# Patient Record
Sex: Male | Born: 1940 | Race: Black or African American | Hispanic: No | Marital: Single | Smoking: Current every day smoker
Health system: Southern US, Community
[De-identification: ages and names within clinical notes are randomized; demographics above are authoritative.]

## PROBLEM LIST (undated history)

## (undated) DIAGNOSIS — R4189 Other symptoms and signs involving cognitive functions and awareness: Secondary | ICD-10-CM

## (undated) DIAGNOSIS — E119 Type 2 diabetes mellitus without complications: Secondary | ICD-10-CM

## (undated) DIAGNOSIS — I10 Essential (primary) hypertension: Secondary | ICD-10-CM

## (undated) DIAGNOSIS — E7800 Pure hypercholesterolemia, unspecified: Secondary | ICD-10-CM

## (undated) DIAGNOSIS — J449 Chronic obstructive pulmonary disease, unspecified: Secondary | ICD-10-CM

## (undated) DIAGNOSIS — F29 Unspecified psychosis not due to a substance or known physiological condition: Secondary | ICD-10-CM

## (undated) HISTORY — PX: ABDOMINAL SURGERY: SHX537

---

## 2010-02-07 ENCOUNTER — Inpatient Hospital Stay: Payer: Self-pay | Admitting: Internal Medicine

## 2010-02-10 ENCOUNTER — Inpatient Hospital Stay: Payer: Self-pay | Admitting: Unknown Physician Specialty

## 2011-04-12 ENCOUNTER — Ambulatory Visit: Payer: Self-pay | Admitting: Cardiovascular Disease

## 2017-02-16 ENCOUNTER — Encounter: Payer: Self-pay | Admitting: Emergency Medicine

## 2017-02-16 ENCOUNTER — Emergency Department: Payer: Medicare Other

## 2017-02-16 ENCOUNTER — Inpatient Hospital Stay
Admission: EM | Admit: 2017-02-16 | Discharge: 2017-02-20 | DRG: 689 | Disposition: A | Discharge status: Home / Self Care | Payer: Medicare Other | Attending: Internal Medicine | Admitting: Internal Medicine

## 2017-02-16 DIAGNOSIS — E860 Dehydration: Secondary | ICD-10-CM | POA: Diagnosis present

## 2017-02-16 DIAGNOSIS — Z8249 Family history of ischemic heart disease and other diseases of the circulatory system: Secondary | ICD-10-CM

## 2017-02-16 DIAGNOSIS — E7800 Pure hypercholesterolemia, unspecified: Secondary | ICD-10-CM | POA: Diagnosis present

## 2017-02-16 DIAGNOSIS — R4182 Altered mental status, unspecified: Secondary | ICD-10-CM | POA: Diagnosis present

## 2017-02-16 DIAGNOSIS — E119 Type 2 diabetes mellitus without complications: Secondary | ICD-10-CM | POA: Diagnosis present

## 2017-02-16 DIAGNOSIS — J449 Chronic obstructive pulmonary disease, unspecified: Secondary | ICD-10-CM | POA: Diagnosis present

## 2017-02-16 DIAGNOSIS — N3001 Acute cystitis with hematuria: Secondary | ICD-10-CM | POA: Diagnosis present

## 2017-02-16 DIAGNOSIS — E785 Hyperlipidemia, unspecified: Secondary | ICD-10-CM | POA: Diagnosis present

## 2017-02-16 DIAGNOSIS — F209 Schizophrenia, unspecified: Secondary | ICD-10-CM | POA: Diagnosis present

## 2017-02-16 DIAGNOSIS — Z7951 Long term (current) use of inhaled steroids: Secondary | ICD-10-CM | POA: Diagnosis not present

## 2017-02-16 DIAGNOSIS — F1027 Alcohol dependence with alcohol-induced persisting dementia: Secondary | ICD-10-CM | POA: Diagnosis present

## 2017-02-16 DIAGNOSIS — I10 Essential (primary) hypertension: Secondary | ICD-10-CM | POA: Diagnosis present

## 2017-02-16 DIAGNOSIS — N3000 Acute cystitis without hematuria: Secondary | ICD-10-CM | POA: Diagnosis present

## 2017-02-16 DIAGNOSIS — F17200 Nicotine dependence, unspecified, uncomplicated: Secondary | ICD-10-CM | POA: Diagnosis present

## 2017-02-16 DIAGNOSIS — G9341 Metabolic encephalopathy: Secondary | ICD-10-CM | POA: Diagnosis present

## 2017-02-16 DIAGNOSIS — Z79899 Other long term (current) drug therapy: Secondary | ICD-10-CM

## 2017-02-16 DIAGNOSIS — Z7982 Long term (current) use of aspirin: Secondary | ICD-10-CM | POA: Diagnosis not present

## 2017-02-16 DIAGNOSIS — N179 Acute kidney failure, unspecified: Secondary | ICD-10-CM | POA: Diagnosis present

## 2017-02-16 DIAGNOSIS — Z7902 Long term (current) use of antithrombotics/antiplatelets: Secondary | ICD-10-CM | POA: Diagnosis not present

## 2017-02-16 DIAGNOSIS — N342 Other urethritis: Secondary | ICD-10-CM

## 2017-02-16 DIAGNOSIS — E876 Hypokalemia: Secondary | ICD-10-CM | POA: Diagnosis present

## 2017-02-16 DIAGNOSIS — I950 Idiopathic hypotension: Secondary | ICD-10-CM

## 2017-02-16 HISTORY — DX: Chronic obstructive pulmonary disease, unspecified: J44.9

## 2017-02-16 HISTORY — DX: Other symptoms and signs involving cognitive functions and awareness: R41.89

## 2017-02-16 HISTORY — DX: Essential (primary) hypertension: I10

## 2017-02-16 HISTORY — DX: Unspecified psychosis not due to a substance or known physiological condition: F29

## 2017-02-16 HISTORY — DX: Pure hypercholesterolemia, unspecified: E78.00

## 2017-02-16 HISTORY — DX: Type 2 diabetes mellitus without complications: E11.9

## 2017-02-16 LAB — COMPREHENSIVE METABOLIC PANEL
ALT: 7 U/L — ABNORMAL LOW (ref 17–63)
AST: 17 U/L (ref 15–41)
Albumin: 3.5 g/dL (ref 3.5–5.0)
Alkaline Phosphatase: 52 U/L (ref 38–126)
Anion gap: 8 (ref 5–15)
BUN: 25 mg/dL — ABNORMAL HIGH (ref 6–20)
CO2: 30 mmol/L (ref 22–32)
Calcium: 9.7 mg/dL (ref 8.9–10.3)
Chloride: 100 mmol/L — ABNORMAL LOW (ref 101–111)
Creatinine, Ser: 1.38 mg/dL — ABNORMAL HIGH (ref 0.61–1.24)
GFR calc Af Amer: 56 mL/min — ABNORMAL LOW (ref >60)
GFR calc non Af Amer: 48 mL/min — ABNORMAL LOW (ref >60)
Glucose, Bld: 128 mg/dL — ABNORMAL HIGH (ref 65–99)
Potassium: 3.7 mmol/L (ref 3.5–5.1)
Sodium: 138 mmol/L (ref 135–145)
Total Bilirubin: 0.4 mg/dL (ref 0.3–1.2)
Total Protein: 7.1 g/dL (ref 6.5–8.1)

## 2017-02-16 LAB — URINALYSIS, COMPLETE (UACMP) WITH MICROSCOPIC
Bilirubin Urine: NEGATIVE
Glucose, UA: NEGATIVE mg/dL
Ketones, ur: NEGATIVE mg/dL
Nitrite: NEGATIVE
Protein, ur: NEGATIVE mg/dL
Specific Gravity, Urine: 1.014 (ref 1.005–1.030)
Squamous Epithelial / LPF: NONE SEEN
pH: 5 (ref 5.0–8.0)

## 2017-02-16 LAB — CBC WITH DIFFERENTIAL/PLATELET
Basophils Absolute: 0 10*3/uL (ref 0–0.1)
Basophils Relative: 1 %
Eosinophils Absolute: 0.1 10*3/uL (ref 0–0.7)
Eosinophils Relative: 2 %
HCT: 35.6 % — ABNORMAL LOW (ref 40.0–52.0)
Hemoglobin: 12 g/dL — ABNORMAL LOW (ref 13.0–18.0)
Lymphocytes Relative: 37 %
Lymphs Abs: 1.3 10*3/uL (ref 1.0–3.6)
MCH: 31 pg (ref 26.0–34.0)
MCHC: 33.8 g/dL (ref 32.0–36.0)
MCV: 91.7 fL (ref 80.0–100.0)
Monocytes Absolute: 0.4 10*3/uL (ref 0.2–1.0)
Monocytes Relative: 12 %
Neutro Abs: 1.6 10*3/uL (ref 1.4–6.5)
Neutrophils Relative %: 48 %
Platelets: 157 10*3/uL (ref 150–440)
RBC: 3.88 MIL/uL — ABNORMAL LOW (ref 4.40–5.90)
RDW: 16.8 % — ABNORMAL HIGH (ref 11.5–14.5)
WBC: 3.4 10*3/uL — ABNORMAL LOW (ref 3.8–10.6)

## 2017-02-16 LAB — GLUCOSE, CAPILLARY
Glucose-Capillary: 111 mg/dL — ABNORMAL HIGH (ref 65–99)
Glucose-Capillary: 113 mg/dL — ABNORMAL HIGH (ref 65–99)
Glucose-Capillary: 102 mg/dL — ABNORMAL HIGH (ref 65–99)

## 2017-02-16 LAB — LACTIC ACID, PLASMA: Lactic Acid, Venous: 1 mmol/L (ref 0.5–1.9)

## 2017-02-16 MED ORDER — ENOXAPARIN SODIUM 40 MG/0.4ML ~~LOC~~ SOLN
40.0000 mg | SUBCUTANEOUS | Status: DC
  Administered 2017-02-16 – 2017-02-19 (×4): 40 mg via SUBCUTANEOUS
  Filled 2017-02-16 (×4): qty 0.4

## 2017-02-16 MED ORDER — NICOTINE 7 MG/24HR TD PT24
7.0000 mg | MEDICATED_PATCH | Freq: Every day | TRANSDERMAL | Status: DC
  Administered 2017-02-17 – 2017-02-19 (×4): 7 mg via TRANSDERMAL
  Filled 2017-02-16 (×5): qty 1

## 2017-02-16 MED ORDER — SODIUM CHLORIDE 0.9 % IV BOLUS (SEPSIS)
1000.0000 mL | Freq: Once | INTRAVENOUS | Status: AC
  Administered 2017-02-16: 1000 mL via INTRAVENOUS

## 2017-02-16 MED ORDER — INSULIN ASPART 100 UNIT/ML ~~LOC~~ SOLN
0.0000 [IU] | Freq: Three times a day (TID) | SUBCUTANEOUS | Status: DC
  Administered 2017-02-17 – 2017-02-19 (×3): 1 [IU] via SUBCUTANEOUS
  Filled 2017-02-16 (×3): qty 1

## 2017-02-16 MED ORDER — ACETAMINOPHEN 325 MG PO TABS: 650.0000 mg | ORAL_TABLET | Freq: Four times a day (QID) | ORAL | Status: DC | PRN

## 2017-02-16 MED ORDER — CLOPIDOGREL BISULFATE 75 MG PO TABS
75.0000 mg | ORAL_TABLET | Freq: Every day | ORAL | Status: DC
  Administered 2017-02-17 – 2017-02-20 (×4): 75 mg via ORAL
  Filled 2017-02-16 (×4): qty 1

## 2017-02-16 MED ORDER — ASPIRIN EC 81 MG PO TBEC
81.0000 mg | DELAYED_RELEASE_TABLET | Freq: Every day | ORAL | Status: DC
  Administered 2017-02-17 – 2017-02-20 (×4): 81 mg via ORAL
  Filled 2017-02-16 (×4): qty 1

## 2017-02-16 MED ORDER — DEXTROSE 5 % IV SOLN
1.0000 g | Freq: Once | INTRAVENOUS | Status: AC
  Administered 2017-02-16: 1 g via INTRAVENOUS
  Filled 2017-02-16: qty 10

## 2017-02-16 MED ORDER — CEFTRIAXONE SODIUM 1 G IJ SOLR
1.0000 g | INTRAMUSCULAR | Status: DC
  Administered 2017-02-17 – 2017-02-20 (×4): 1 g via INTRAVENOUS
  Filled 2017-02-16 (×4): qty 10

## 2017-02-16 MED ORDER — PRAVASTATIN SODIUM 20 MG PO TABS
40.0000 mg | ORAL_TABLET | Freq: Every day | ORAL | Status: DC
  Administered 2017-02-16 – 2017-02-19 (×4): 40 mg via ORAL
  Filled 2017-02-16 (×4): qty 2

## 2017-02-16 MED ORDER — ACETAMINOPHEN 650 MG RE SUPP: 650.0000 mg | Freq: Four times a day (QID) | RECTAL | Status: DC | PRN

## 2017-02-16 MED ORDER — INSULIN ASPART 100 UNIT/ML ~~LOC~~ SOLN: 0.0000 [IU] | Freq: Every day | SUBCUTANEOUS | Status: DC

## 2017-02-16 MED ORDER — TRIHEXYPHENIDYL HCL 5 MG PO TABS
5.0000 mg | ORAL_TABLET | Freq: Two times a day (BID) | ORAL | Status: DC
  Administered 2017-02-17 – 2017-02-20 (×7): 5 mg via ORAL
  Filled 2017-02-16 (×8): qty 1

## 2017-02-16 MED ORDER — PALIPERIDONE ER 3 MG PO TB24
9.0000 mg | ORAL_TABLET | Freq: Every day | ORAL | Status: DC
  Administered 2017-02-16 – 2017-02-19 (×4): 9 mg via ORAL
  Filled 2017-02-16 (×5): qty 3

## 2017-02-16 MED ORDER — LORAZEPAM 2 MG/ML IJ SOLN: 1.0000 mg | Freq: Once | INTRAMUSCULAR | Status: DC

## 2017-02-16 MED ORDER — SODIUM CHLORIDE 0.9 % IV SOLN
INTRAVENOUS | Status: DC
  Administered 2017-02-16: 23:00:00 via INTRAVENOUS

## 2017-02-16 MED ORDER — TRAZODONE HCL 50 MG PO TABS
50.0000 mg | ORAL_TABLET | Freq: Every day | ORAL | Status: DC
  Administered 2017-02-16 – 2017-02-19 (×4): 50 mg via ORAL
  Filled 2017-02-16 (×4): qty 1

## 2017-02-16 MED ORDER — HYDRALAZINE HCL 20 MG/ML IJ SOLN
10.0000 mg | INTRAMUSCULAR | Status: DC | PRN
  Administered 2017-02-16 – 2017-02-17 (×2): 10 mg via INTRAVENOUS
  Filled 2017-02-16 (×2): qty 1

## 2017-02-16 MED ORDER — SODIUM CHLORIDE 0.9 % IV BOLUS (SEPSIS): 1000.0000 mL | Freq: Once | INTRAVENOUS | Status: DC

## 2017-02-16 MED ORDER — SODIUM CHLORIDE 0.9 % IV SOLN
Freq: Once | INTRAVENOUS | Status: AC
  Administered 2017-02-16: 17:00:00 via INTRAVENOUS

## 2017-02-16 NOTE — ED Triage Notes (Signed)
[facility] from [facility].  He stumbled coming out of bathroom per EMS report according to group home staff.  Staff concerned blood sugar may be low so called EMS.  Blood sugar 118 with EMS.  Pt has no complaints at all but wanted to come for a "check up".  Went through numerous symptoms with pt and denies all symptoms and just wants a routine check up to make sure nothing wrong.  Alert and oriented. NAD.  EMS walked pt around at group home and ambulated without difficulty per their report.

## 2017-02-16 NOTE — Progress Notes (Signed)
Patient ID: Aliene BeamsRoosevelt Johnson, male   DOB: 08/01/1941, 76 y.o.   MRN: [id number] Called brother and given update

## 2017-02-16 NOTE — ED Notes (Signed)
Attempted to obtain urine sample. Patient unable to void at this time.
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
## 2017-02-16 NOTE — ED Notes (Signed)
Patient transported to CT 

## 2017-02-16 NOTE — ED Notes (Signed)
Upon entering room patient found ambulating with steady flow of blood coming from left AC. Patient removed IV. Blood noted to entire floor, chair, and stretcher. When asked why patient removed his IV he states, "I was cold and wanted to get warm". Patient reoriented. Provider aware.

## 2017-02-16 NOTE — ED Notes (Signed)
Pt going to B side where patient can be monitored more frequently and be on a monitor. Report given to [place], RN
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
## 2017-02-16 NOTE — ED Notes (Signed)
EVS at bedside to mop floor.

## 2017-02-16 NOTE — ED Notes (Signed)
Pt laying in bed denies pain, dizziness, injury, cough. Pt seems poor historian. + orthostatic vs , swaying when standing.

## 2017-02-16 NOTE — ED Notes (Signed)
Pt settled into room, given warm blanket and resting comfortably at this time.

## 2017-02-17 LAB — GLUCOSE, CAPILLARY
Glucose-Capillary: 114 mg/dL — ABNORMAL HIGH (ref 65–99)
Glucose-Capillary: 142 mg/dL — ABNORMAL HIGH (ref 65–99)
Glucose-Capillary: 108 mg/dL — ABNORMAL HIGH (ref 65–99)
Glucose-Capillary: 154 mg/dL — ABNORMAL HIGH (ref 65–99)

## 2017-02-17 LAB — BASIC METABOLIC PANEL
Anion gap: 11 (ref 5–15)
BUN: 20 mg/dL (ref 6–20)
CO2: 26 mmol/L (ref 22–32)
Calcium: 9.3 mg/dL (ref 8.9–10.3)
Chloride: 102 mmol/L (ref 101–111)
Creatinine, Ser: 1.12 mg/dL (ref 0.61–1.24)
GFR calc Af Amer: >60 mL/min (ref >60)
GFR calc non Af Amer: >60 mL/min (ref >60)
Glucose, Bld: 98 mg/dL (ref 65–99)
Potassium: 2.9 mmol/L — ABNORMAL LOW (ref 3.5–5.1)
Sodium: 139 mmol/L (ref 135–145)

## 2017-02-17 LAB — CBC
HCT: 37.3 % — ABNORMAL LOW (ref 40.0–52.0)
Hemoglobin: 12.6 g/dL — ABNORMAL LOW (ref 13.0–18.0)
MCH: 30.3 pg (ref 26.0–34.0)
MCHC: 33.8 g/dL (ref 32.0–36.0)
MCV: 89.7 fL (ref 80.0–100.0)
Platelets: 157 10*3/uL (ref 150–440)
RBC: 4.16 MIL/uL — ABNORMAL LOW (ref 4.40–5.90)
RDW: 16.5 % — ABNORMAL HIGH (ref 11.5–14.5)
WBC: 5.2 10*3/uL (ref 3.8–10.6)

## 2017-02-17 MED ORDER — POTASSIUM CHLORIDE IN NACL 40-0.9 MEQ/L-% IV SOLN
INTRAVENOUS | Status: DC
  Administered 2017-02-17 – 2017-02-18 (×2): 100 mL/h via INTRAVENOUS
  Filled 2017-02-17 (×4): qty 1000

## 2017-02-17 MED ORDER — HALOPERIDOL LACTATE 5 MG/ML IJ SOLN
1.0000 mg | INTRAMUSCULAR | Status: DC | PRN
  Filled 2017-02-17: qty 0.2

## 2017-02-17 MED ORDER — RISPERIDONE 1 MG PO TBDP
1.0000 mg | ORAL_TABLET | ORAL | Status: DC | PRN
  Filled 2017-02-17: qty 1
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
## 2017-02-17 NOTE — Consult Note (Signed)
Psychiatry: 76 year old man with chronic dementia and possible history of psychosis presented to ER with vague complaints. Labs indicate UTI. Current mental status likely at or near his baseline.  Continue current antipsychotic meds. Patient at some risk for sundowning or other agitation based on diagnosis. Will add orders for prns. Will follow.

## 2017-02-17 NOTE — Progress Notes (Signed)
[facility]   PATIENT NAME: Terrence Johnson    MR#:  [id number]  DATE OF BIRTH:  08/06/1941  SUBJECTIVE:  Doing ewell Low bp this am  REVIEW OF SYSTEMS:   Review of Systems  Unable to perform ROS: Psychiatric disorder   Tolerating Diet: Tolerating PT:   DRUG ALLERGIES:  No Known Allergies  VITALS:  Blood pressure (!) 162/61, pulse 72, temperature 97.5 F (36.4 C), temperature source Oral, resp. rate 18, height 5\' 1"  (1.549 m), weight 53 kg (116 lb 14.4 oz), SpO2 99 %.  PHYSICAL EXAMINATION:   Physical Exam  GENERAL:  76 y.o.-year-old patient lying in the bed with no acute distress.  EYES: Pupils equal, round, reactive to light and accommodation. No scleral icterus. Extraocular muscles intact.  HEENT: Head atraumatic, normocephalic. Oropharynx and nasopharynx clear.  NECK:  Supple, no jugular venous distention. No thyroid enlargement, no tenderness.  LUNGS: Normal breath sounds bilaterally, no wheezing, rales, rhonchi. No use of accessory muscles of respiration.  CARDIOVASCULAR: S1, S2 normal. No murmurs, rubs, or gallops.  ABDOMEN: Soft, nontender, nondistended. Bowel sounds present. No organomegaly or mass.  EXTREMITIES: No cyanosis, clubbing or edema b/l.    NEUROLOGIC: Cranial nerves II through XII are intact. No focal Motor or sensory deficits b/l.   PSYCHIATRIC:  patient is alert and oriented x 3.  SKIN: No obvious rash, lesion, or ulcer.   LABORATORY PANEL:  CBC  Recent Labs Lab 02/17/17 0352  WBC 5.2  HGB 12.6*  HCT 37.3*  PLT 157    Chemistries   Recent Labs Lab 02/16/17 1712 02/17/17 0352  NA 138 139  K 3.7 2.9*  CL 100* 102  CO2 30 26  GLUCOSE 128* 98  BUN 25* 20  CREATININE 1.38* 1.12  CALCIUM 9.7 9.3  AST 17  --   ALT 7*  --   ALKPHOS 52  --   BILITOT 0.4  --    Cardiac Enzymes No results for input(s): TROPONINI in the last 168 hours. RADIOLOGY:  Dg Chest 2 View  Result Date:  02/16/2017 CLINICAL DATA:  Altered mental status weakness. EXAM: CHEST  2 VIEW COMPARISON:  02/06/2010 FINDINGS: Upper limits normal heart size again noted. There is no evidence of focal airspace disease, pulmonary edema, suspicious pulmonary nodule/mass, pleural effusion, or pneumothorax. No acute bony abnormalities are identified. Remote left rib and left clavicle fractures again noted. IMPRESSION: No evidence of acute cardiopulmonary disease. Electronically Signed   By: Harmon PierJeffrey  Hu M.D.   On: 02/16/2017 19:33   Ct Head Wo Contrast  Result Date: 02/16/2017 CLINICAL DATA:  Altered mental status. Difficulty ambulating tonight. EXAM: CT HEAD WITHOUT CONTRAST TECHNIQUE: Contiguous axial images were obtained from the base of the skull through the vertex without intravenous contrast. COMPARISON:  None. FINDINGS: Brain: Diffusely enlarged ventricles and subarachnoid spaces. Patchy white matter low density in both cerebral hemispheres. No intracranial hemorrhage, mass lesion or CT evidence of acute infarction. Vascular: No hyperdense vessel or unexpected calcification. Skull: Normal. Negative for fracture or focal lesion. Sinuses/Orbits: Unremarkable. Other: None. IMPRESSION: No acute abnormality. Mild to moderate diffuse cerebral and cerebellar atrophy and moderate to marked chronic small vessel white matter ischemic changes in both cerebral hemispheres. Electronically Signed   By: Beckie SaltsSteven  Reid M.D.   On: 02/16/2017 19:17   ASSESSMENT AND PLAN:  Terrence Johnson  is a 76 y.o. male with a known history of Psychotic disorder, hypertension and hyperlipidemia and diabetes. He states he  is here for a checkup. He offers no complaints and feels okay. As per his facility he has been acting out and not his usual self. Patient does answer some questions  1. Acute cystitis with hematuria and hypotension. Receiving IV fluid hydration.  -cont IV rocephin and follow-up urine culture results. Hold antihypertensive medications.   2. Acute encephalopathy with history of psychiatric disorder. Hopefully this is worsened with acute cystitis and will get better.  - will get a psychiatric consultation and continue psychiatric medications. 3. Acute kidney injury and dehydration. IV fluid hydration 4. Type 2 diabetes mellitus controlled. Hold Glucophage and put on sliding scale instead 5. Hyperlipidemia unspecified on pravastatin 6. Tobacco abuse. nicotine patch. Smoking cessation counseling done 4 minutes by me. Not sure if he understood.  Case discussed with Care Management/Social Worker. Management plans discussed with the patient, family and they are in agreement.  CODE STATUS: full  DVT Prophylaxis: lovenox  TOTAL TIME TAKING CARE OF THIS PATIENT: *30* minutes.  >50% time spent on counselling and coordination of care  POSSIBLE D/C IN *1-2* DAYS, DEPENDING ON CLINICAL CONDITION.  Note: This dictation was prepared with Dragon dictation along with smaller phrase technology. Any transcriptional errors that result from this process are unintentional.  Prateek Knipple M.D on 02/17/2017 at 8:17 PM  Between 7am to 6pm - Pager - [phone]  After 6pm go to [email or web address] - password [email or web address]  [facility]  Office  [phone]  CC: Primary care physician; Patient, No Pcp Per

## 2017-02-18 DIAGNOSIS — F203 Undifferentiated schizophrenia: Secondary | ICD-10-CM

## 2017-02-18 DIAGNOSIS — F1027 Alcohol dependence with alcohol-induced persisting dementia: Secondary | ICD-10-CM

## 2017-02-18 DIAGNOSIS — F209 Schizophrenia, unspecified: Secondary | ICD-10-CM

## 2017-02-18 LAB — GLUCOSE, CAPILLARY
Glucose-Capillary: 104 mg/dL — ABNORMAL HIGH (ref 65–99)
Glucose-Capillary: 112 mg/dL — ABNORMAL HIGH (ref 65–99)
Glucose-Capillary: 122 mg/dL — ABNORMAL HIGH (ref 65–99)
Glucose-Capillary: 162 mg/dL — ABNORMAL HIGH (ref 65–99)

## 2017-02-18 MED ORDER — CARVEDILOL 12.5 MG PO TABS
12.5000 mg | ORAL_TABLET | Freq: Two times a day (BID) | ORAL | Status: DC
  Administered 2017-02-19 – 2017-02-20 (×3): 12.5 mg via ORAL
  Filled 2017-02-18 (×3): qty 1

## 2017-02-18 MED ORDER — LISINOPRIL 20 MG PO TABS
20.0000 mg | ORAL_TABLET | Freq: Once | ORAL | Status: AC
  Administered 2017-02-18: 20 mg via ORAL
  Filled 2017-02-18: qty 1

## 2017-02-18 MED ORDER — POTASSIUM CHLORIDE 20 MEQ PO PACK
20.0000 meq | PACK | Freq: Two times a day (BID) | ORAL | Status: AC
  Administered 2017-02-18 (×2): 20 meq via ORAL
  Filled 2017-02-18 (×2): qty 1

## 2017-02-18 MED ORDER — CARVEDILOL 12.5 MG PO TABS
12.5000 mg | ORAL_TABLET | Freq: Once | ORAL | Status: AC
  Administered 2017-02-18: 12.5 mg via ORAL
  Filled 2017-02-18: qty 1

## 2017-02-18 MED ORDER — SODIUM CHLORIDE 0.9 % IV SOLN
INTRAVENOUS | Status: DC
  Administered 2017-02-18 – 2017-02-19 (×2): via INTRAVENOUS

## 2017-02-18 MED ORDER — LISINOPRIL 20 MG PO TABS
20.0000 mg | ORAL_TABLET | Freq: Every day | ORAL | Status: DC
  Administered 2017-02-19 – 2017-02-20 (×2): 20 mg via ORAL
  Filled 2017-02-18 (×2): qty 1

## 2017-02-18 NOTE — Clinical Social Work Note (Signed)
CSW has attempted to contact both the patient's group home and his brother to discuss discharge planning and to collect information for assessment purposes. The CSW was not able to reach either, and neither contact has the ability to receive voicemail. CSW will continue to attempt contact.  Argentina PonderKaren Martha Pasqualino Witherspoon, MSW, Theresia MajorsLCSWA [phone]

## 2017-02-18 NOTE — Progress Notes (Signed)
Subjective: Patient says he feels better. Appears to be more oriented today.  Objective: Vital signs in last 24 hours: Temp:  [97.5 F (36.4 C)-98.2 F (36.8 C)] 98 F (36.7 C) (06/30 1200) Pulse Rate:  [72-94] 77 (06/30 1200) Resp:  [18-20] 19 (06/30 1200) BP: (142-181)/(61-73) 163/73 (06/30 1200) SpO2:  [98 %-100 %] 98 % (06/30 1200) Weight change:  Last BM Date: 02/16/17  Intake/Output from previous day: 06/29 0701 - 06/30 0700 In: 1704 [P.O.:890; I.V.:764; IV Piggyback:50] Out: 775 [Urine:775] Intake/Output this shift: Total I/O In: 240 [P.O.:240] Out: 525 [Urine:525]  General appearance: alert, cooperative and no distress Head: Normocephalic, without obvious abnormality, atraumatic Resp: clear to auscultation bilaterally and normal percussion bilaterally Cardio: regular rate and rhythm, S1, S2 normal, no murmur, click, rub or gallop GI: soft, non-tender; bowel sounds normal; no masses,  no organomegaly Neurologic: Alert and oriented X 3, normal strength and tone. Normal symmetric reflexes. Normal coordination and gait  Lab Results:  Recent Labs  02/16/17 1712 02/17/17 0352  WBC 3.4* 5.2  HGB 12.0* 12.6*  HCT 35.6* 37.3*  PLT 157 157   BMET  Recent Labs  02/16/17 1712 02/17/17 0352  NA 138 139  K 3.7 2.9*  CL 100* 102  CO2 30 26  GLUCOSE 128* 98  BUN 25* 20  CREATININE 1.38* 1.12  CALCIUM 9.7 9.3    Studies/Results: Dg Chest 2 View  Result Date: 02/16/2017 CLINICAL DATA:  Altered mental status weakness. EXAM: CHEST  2 VIEW COMPARISON:  02/06/2010 FINDINGS: Upper limits normal heart size again noted. There is no evidence of focal airspace disease, pulmonary edema, suspicious pulmonary nodule/mass, pleural effusion, or pneumothorax. No acute bony abnormalities are identified. Remote left rib and left clavicle fractures again noted. IMPRESSION: No evidence of acute cardiopulmonary disease. Electronically Signed   By: Harmon PierJeffrey  Hu M.D.   On: 02/16/2017  19:33   Ct Head Wo Contrast  Result Date: 02/16/2017 CLINICAL DATA:  Altered mental status. Difficulty ambulating tonight. EXAM: CT HEAD WITHOUT CONTRAST TECHNIQUE: Contiguous axial images were obtained from the base of the skull through the vertex without intravenous contrast. COMPARISON:  None. FINDINGS: Brain: Diffusely enlarged ventricles and subarachnoid spaces. Patchy white matter low density in both cerebral hemispheres. No intracranial hemorrhage, mass lesion or CT evidence of acute infarction. Vascular: No hyperdense vessel or unexpected calcification. Skull: Normal. Negative for fracture or focal lesion. Sinuses/Orbits: Unremarkable. Other: None. IMPRESSION: No acute abnormality. Mild to moderate diffuse cerebral and cerebellar atrophy and moderate to marked chronic small vessel white matter ischemic changes in both cerebral hemispheres. Electronically Signed   By: Beckie SaltsSteven  Reid M.D.   On: 02/16/2017 19:17    Medications: I have reviewed the patient's current medications.  Assessment/Plan: 1. Urinary tract infection. Cultures are growing gram-negative rods. We'll continue Rocephin for now. Wait for sensitivities before adjusting.  2. Metabolic encephalopathy. This appears to have cleared now. He seems to be alert and oriented back to his baseline 3. Acute kidney injury. This is improved with IV fluids. 4. Hypokalemia. He's been given some IV potassium overnight. We'll change him over to by mouth and recheck potassium in the morning. 5. Diabetes. Continue sliding scale for now  Total time spent 25 minutes  LOS: 2 days   Gracelyn NurseJohnston,  John D 02/18/2017, 1:37 PM
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
## 2017-02-18 NOTE — [place] FL2 (Signed)
[place] MEDICAID FL2 LEVEL OF CARE SCREENING TOOL     IDENTIFICATION  Patient Name: Terrence Johnson Birthdate: 10/20/1940 Sex: male Admission Date (Current Location): 02/16/2017  [place]   Facility and Address:  [facility], [address], [place], [place] [address]      Provider Number: [id number]  Attending Physician Name and Address:  Gracelyn NurseJohnston, John D, MD  Relative Name and Phone Number:       Current Level of Care: Hospital [facility] Prior Approval Number:    Date Approved/Denied:   PASRR Number:    Discharge Plan: Domiciliary (Rest home)    Current Diagnoses: Patient Active Problem List   Diagnosis Date Noted  . Acute cystitis 02/16/2017    Orientation RESPIRATION BLADDER Height & Weight     Self, Situation  Normal Continent Weight: 116 lb 14.4 oz (53 kg) Height:  5\' 1"  (154.9 cm)  BEHAVIORAL SYMPTOMS/MOOD NEUROLOGICAL BOWEL NUTRITION STATUS      Continent Diet (Carb modified)  AMBULATORY STATUS COMMUNICATION OF NEEDS Skin   Supervision Verbally Normal                       Personal Care Assistance Level of Assistance  Bathing, Feeding, Dressing Bathing Assistance: Independent Feeding assistance: Independent Dressing Assistance: Independent     Functional Limitations Info             SPECIAL CARE FACTORS FREQUENCY                       Contractures      Additional Factors Info                  Current Medications (02/18/2017):  This is the current hospital active medication list Current Facility-Administered Medications  Medication Dose Route Frequency Provider Last Rate Last Dose  . 0.9 %  sodium chloride infusion   Intravenous Continuous Gracelyn NurseJohnston, John D, MD 75 mL/hr at 02/18/17 1411    . acetaminophen (TYLENOL) tablet 650 mg  650 mg Oral Q6H PRN Alford HighlandWieting, Richard, MD       Or  . acetaminophen (TYLENOL) suppository 650 mg  650 mg  Rectal Q6H PRN Alford HighlandWieting, Richard, MD      . aspirin EC tablet 81 mg  81 mg Oral Daily Alford HighlandWieting, Richard, MD   81 mg at 02/18/17 [phone]  . cefTRIAXone (ROCEPHIN) 1 g in dextrose 5 % 50 mL IVPB  1 g Intravenous Q24H Alford HighlandWieting, Richard, MD   Stopped at 02/18/17 [phone]  . clopidogrel (PLAVIX) tablet 75 mg  75 mg Oral Daily Alford HighlandWieting, Richard, MD   75 mg at 02/18/17 [phone]  . enoxaparin (LOVENOX) injection 40 mg  40 mg Subcutaneous Q24H Alford HighlandWieting, Richard, MD   40 mg at 02/17/17 2158  . haloperidol lactate (HALDOL) injection 1 mg  1 mg Intravenous Q4H PRN Clapacs, John T, MD      . insulin aspart (novoLOG) injection 0-5 Units  0-5 Units Subcutaneous QHS Wieting, Richard, MD      . insulin aspart (novoLOG) injection 0-9 Units  0-9 Units Subcutaneous TID WC Alford HighlandWieting, Richard, MD   1 Units at 02/17/17 1202  . LORazepam (ATIVAN) injection 1 mg  1 mg Intravenous Once Charlynne PanderYao, David Hsienta, MD   Stopped at 02/18/17 0830  . nicotine (NICODERM CQ - dosed in mg/24 hr) patch 7 mg  7 mg Transdermal Daily Oralia ManisWillis, David, MD  7 mg at 02/17/17 2256  . paliperidone (INVEGA) 24 hr tablet 9 mg  9 mg Oral QHS Alford Highland, MD   9 mg at 02/17/17 2158  . potassium chloride (KLOR-CON) packet 20 mEq  20 mEq Oral BID Gracelyn Nurse, MD   20 mEq at 02/18/17 1411  . pravastatin (PRAVACHOL) tablet 40 mg  40 mg Oral QHS Alford Highland, MD   40 mg at 02/17/17 2158  . risperiDONE (RISPERDAL M-TABS) disintegrating tablet 1 mg  1 mg Oral Q4H PRN Clapacs, John T, MD      . traZODone (DESYREL) tablet 50 mg  50 mg Oral QHS Alford Highland, MD   50 mg at 02/17/17 2158  . trihexyphenidyl (ARTANE) tablet 5 mg  5 mg Oral BID WC Alford Highland, MD   5 mg at 02/18/17 1610     Discharge Medications: Please see discharge summary for a list of discharge medications.  Relevant Imaging Results:  Relevant Lab Results:   Additional Information    Judi Cong, LCSW

## 2017-02-18 NOTE — Clinical Social Work Note (Signed)
Clinical Social Work Assessment  Patient Details  Name: Terrence Johnson MRN: [id number] Date of Birth: 1941-06-15  Date of referral:  02/18/17               Reason for consult:  Facility Placement                Permission sought to share information with:  Facility Art therapist granted to share information::  Yes, Verbal Permission Granted  Name::        Agency::     Relationship::     Contact Information:     Housing/Transportation Living arrangements for the past 2 months:  Group Home Source of Information:  Patient, Facility Patient Interpreter Needed:  None Criminal Activity/Legal Involvement Pertinent to Current Situation/Hospitalization:  No - Comment as needed Significant Relationships:  Siblings, [employer] Lives with:  Facility Resident Do you feel safe going back to the place where you live?  Yes Need for family participation in patient care:  No (Coment)  Care giving concerns: Patient admitted from a group home.    Social Worker assessment / plan:   CSW met with the patient at bedside to discuss discharge planning. The patient gave permission to contact his family care home ([facility] [phone]) and his brother. The CSW was unable to reach the brother, but the representative at the family care home reported that the patient can return when stable by facility transport. The CSW advised the representative that the patient has a UTI being treated with an oral antibiotic and may be stable for discharge as early as tomorrow. The representative thanked the CSW for the update and indicated no barriers to discharge tomorrow.   Employment status:  Retired Forensic scientist:  Medicaid In [place] PT Recommendations:  No Follow Up Information / Referral to community resources:     Patient/Family's Response to care:  The patient thanked the CSW for assistance.  Patient/Family's Understanding of and Emotional Response to Diagnosis, Current  Treatment, and Prognosis:  The patient's facility is aware of his diagnosis and treatment and are in agreement with his return.  Emotional Assessment Appearance:  Appears stated age Attitude/Demeanor/Rapport:   (Pleasant) Affect (typically observed):  Appropriate, Pleasant Orientation:  Oriented to Self, Oriented to Place, Oriented to Situation Alcohol / Substance use:  Never Used Psych involvement (Current and /or in the community):  Yes (Comment) (Psych has been following for possible sundowners and due to psychiatric hx. No needs so far.)  Discharge Needs  Concerns to be addressed:  Care Coordination, Discharge Planning Concerns Readmission within the last 30 days:  No Current discharge risk:  None Barriers to Discharge:  Continued Medical Work up   [employer], LCSW 02/18/2017, 3:21 PM

## 2017-02-19 LAB — BASIC METABOLIC PANEL
Anion gap: 5 (ref 5–15)
BUN: 15 mg/dL (ref 6–20)
CO2: 26 mmol/L (ref 22–32)
Calcium: 8.9 mg/dL (ref 8.9–10.3)
Chloride: 105 mmol/L (ref 101–111)
Creatinine, Ser: 1 mg/dL (ref 0.61–1.24)
GFR calc Af Amer: >60 mL/min (ref >60)
GFR calc non Af Amer: >60 mL/min (ref >60)
Glucose, Bld: 92 mg/dL (ref 65–99)
Potassium: 3.5 mmol/L (ref 3.5–5.1)
Sodium: 136 mmol/L (ref 135–145)

## 2017-02-19 LAB — GLUCOSE, CAPILLARY
Glucose-Capillary: 90 mg/dL (ref 65–99)
Glucose-Capillary: 112 mg/dL — ABNORMAL HIGH (ref 65–99)
Glucose-Capillary: 140 mg/dL — ABNORMAL HIGH (ref 65–99)
Glucose-Capillary: 123 mg/dL — ABNORMAL HIGH (ref 65–99)

## 2017-02-19 MED ORDER — AMLODIPINE BESYLATE 5 MG PO TABS
5.0000 mg | ORAL_TABLET | Freq: Every day | ORAL | Status: DC
  Administered 2017-02-19 – 2017-02-20 (×2): 5 mg via ORAL
  Filled 2017-02-19 (×2): qty 1

## 2017-02-19 NOTE — Progress Notes (Signed)
Subjective: Patient has no complaints morning.  Objective: Vital signs in last 24 hours: Temp:  [97.9 F (36.6 C)-98.3 F (36.8 C)] 98.3 F (36.8 C) (07/01 0426) Pulse Rate:  [66-83] 69 (07/01 0426) Resp:  [16-19] 16 (07/01 0426) BP: (163-206)/(67-136) 174/67 (07/01 0426) SpO2:  [95 %-100 %] 100 % (07/01 0426) Weight change:  Last BM Date: 02/16/17  Intake/Output from previous day: 06/30 0701 - 07/01 0700 In: 2508 [P.O.:420; I.V.:2038; IV Piggyback:50] Out: 2125 [Urine:2125] Intake/Output this shift: Total I/O In: 0  Out: 125 [Urine:125]  General appearance: no distress Resp: clear to auscultation bilaterally and normal percussion bilaterally Cardio: regular rate and rhythm, S1, S2 normal, no murmur, click, rub or gallop GI: soft, non-tender; bowel sounds normal; no masses,  no organomegaly Neurologic: Alert and oriented X 3, normal strength and tone. Normal symmetric reflexes. Normal coordination and gait  Lab Results:  Recent Labs  02/16/17 1712 02/17/17 0352  WBC 3.4* 5.2  HGB 12.0* 12.6*  HCT 35.6* 37.3*  PLT 157 157   BMET  Recent Labs  02/17/17 0352 02/19/17 0506  NA 139 136  K 2.9* 3.5  CL 102 105  CO2 26 26  GLUCOSE 98 92  BUN 20 15  CREATININE 1.12 1.00  CALCIUM 9.3 8.9    Studies/Results: No results found.  Medications: I have reviewed the patient's current medications.  Assessment/Plan: 1. Urinary tract infection. Cultures are growing gram-negative rods. on IV Rocephin. Will likely complete antibiotic therapy by tomorrow.   2. Metabolic encephalopathy. This appears to have cleared now. He seems to be alert and oriented back to his baseline. 3. Acute kidney injury. Resolved with IV fluids. We'll discontinue IV fluids. 4. Hypokalemia. Resolved with repletion.  5. Diabetes. Continue sliding scale for now 6. Psychosis. Appreciate psychiatry input. Continue current medications  Total time spent 20 minutes  LOS: 3 days   Gracelyn NurseJohnston,  John  D 02/19/2017, 9:07 AM

## 2017-02-20 LAB — URINE CULTURE: Culture: >=100000 — AB

## 2017-02-20 LAB — GLUCOSE, CAPILLARY: Glucose-Capillary: 90 mg/dL (ref 65–99)

## 2017-02-20 NOTE — Discharge Summary (Signed)
Physician Discharge Summary  Patient ID: Terrence Johnson MRN: [id number] DOB/AGE: 75/12/1940 75 y.o.  Admit date: 02/16/2017 Discharge date: 02/20/2017  Admission Diagnoses:1. Urinary tract infection.  2. Metabolic encephalopathy.  3. Acute kidney injury.  4. Hypokalemia.  5. Diabetes.  6. Schizophrenia  Discharge Diagnoses: 1. Urinary tract infection 2. Metabolic encephalopathy 3. Acute kidney injury 4. Hypokalemia Principal Problem:   Schizophrenia (HCC) Active Problems:   Acute cystitis   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HCC)   Discharged Condition: stable  Hospital Course: 1. Urinary tract infection. Cultures are growing gram-negative rods. Has received 3 days of IV Rocephin. No further antibiotics needed.   2. Metabolic encephalopathy. secondary to his UTI. He is now back to baseline.  3. Acute kidney injury. Resolved with IV fluids. 4. Hypokalemia. Repleted  5. Diabetes. Back on home medications  6. Schizophrenia. He is evaluated by psychiatry and maintain on his usual medications  Total time spent 35 minutes  Consults: psychiatry  Significant Diagnostic Studies: labs: n  Treatments: n  Discharge Exam: Blood pressure (!) 147/73, pulse 78, temperature 98.8 F (37.1 C), temperature source Oral, resp. rate 16, height 5\' 1"  (1.549 m), weight 53 kg (116 lb 14.4 oz), SpO2 99 %. General appearance: no distress Resp: clear to auscultation bilaterally Cardio: regular rate and rhythm, S1, S2 normal, no murmur, click, rub or gallop  Disposition:  discharge back to group home.  Discharge Instructions    Diet Carb Modified    Complete by:  As directed    Increase activity slowly    Complete by:  As directed      Allergies as of 02/20/2017   No Known Allergies     Medication List    TAKE these medications   acetaminophen 325 MG tablet Commonly known as:  TYLENOL Take 650 mg by mouth every 6 (six) hours as needed.   aspirin EC 81 MG tablet Take 81 mg by mouth daily.    carvedilol 12.5 MG tablet Commonly known as:  COREG Take 12.5 mg by mouth 2 (two) times daily with a meal.   chlorthalidone 25 MG tablet Commonly known as:  HYGROTON Take 12.5 mg by mouth daily.   clopidogrel 75 MG tablet Commonly known as:  PLAVIX Take 75 mg by mouth daily.   COMBIVENT RESPIMAT 20-100 MCG/ACT Aers respimat Generic drug:  Ipratropium-Albuterol Inhale 1 puff into the lungs 2 (two) times daily.   lisinopril 20 MG tablet Commonly known as:  PRINIVIL,ZESTRIL Take 20 mg by mouth daily.   metFORMIN 500 MG tablet Commonly known as:  GLUCOPHAGE Take 500 mg by mouth daily with breakfast.   naproxen 500 MG tablet Commonly known as:  NAPROSYN Take 500 mg by mouth 2 (two) times daily as needed.   paliperidone 9 MG 24 hr tablet Commonly known as:  INVEGA Take 9 mg by mouth at bedtime.   pravastatin 40 MG tablet Commonly known as:  PRAVACHOL Take 40 mg by mouth at bedtime.   traZODone 50 MG tablet Commonly known as:  DESYREL Take 50 mg by mouth at bedtime.   trihexyphenidyl 5 MG tablet Commonly known as:  ARTANE Take 5 mg by mouth 2 (two) times daily with a meal.        Signed: Gracelyn NurseJohnston,  John D 02/20/2017, 8:13 AM

## 2017-02-20 NOTE — NC FL2 (Signed)
  [place] MEDICAID FL2 LEVEL OF CARE SCREENING TOOL     IDENTIFICATION  Patient Name: Terrence BeamsRoosevelt Paradiso Birthdate: 09/11/1940 Sex: male Admission Date (Current Location): 02/16/2017  [place]   Facility and Address:  [facility], [address], [place], [place] [address]      Provider Number: [id number]  Attending Physician Name and Address:  Gracelyn NurseJohnston, John D, MD  Relative Name and Phone Number:       Current Level of Care: Hospital [facility] Prior Approval Number:    Date Approved/Denied:   PASRR Number:    Discharge Plan: Domiciliary (Rest home)    Current Diagnoses: Patient Active Problem List   Diagnosis Date Noted  . Schizophrenia (HCC) 02/18/2017  . 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 Acute cystitis 02/16/2017    Orientation RESPIRATION BLADDER Height & Weight     Self, Situation  Normal Continent Weight: 116 lb 14.4 oz (53 kg) Height:  5\' 1"  (154.9 cm)  BEHAVIORAL SYMPTOMS/MOOD NEUROLOGICAL BOWEL NUTRITION STATUS      Continent Diet (Carb modified)  AMBULATORY STATUS COMMUNICATION OF NEEDS Skin   Supervision Verbally Normal                       Personal Care Assistance Level of Assistance  Bathing, Feeding, Dressing Bathing Assistance: Independent Feeding assistance: Independent Dressing Assistance: Independent     Functional Limitations Info             SPECIAL CARE FACTORS FREQUENCY                       Contractures Contractures Info: Not present    Additional Factors Info  Code Status Code Status Info: full                 Medication List    TAKE these medications   acetaminophen 325 MG tablet Commonly known as:  TYLENOL Take 650 mg by mouth every 6 (six) hours as needed.   aspirin EC 81 MG tablet Take 81 mg by mouth daily.   carvedilol 12.5 MG tablet Commonly known as:  COREG Take 12.5 mg by mouth 2 (two) times  daily with a meal.   chlorthalidone 25 MG tablet Commonly known as:  HYGROTON Take 12.5 mg by mouth daily.   clopidogrel 75 MG tablet Commonly known as:  PLAVIX Take 75 mg by mouth daily.   COMBIVENT RESPIMAT 20-100 MCG/ACT Aers respimat Generic drug:  Ipratropium-Albuterol Inhale 1 puff into the lungs 2 (two) times daily.   lisinopril 20 MG tablet Commonly known as:  PRINIVIL,ZESTRIL Take 20 mg by mouth daily.   metFORMIN 500 MG tablet Commonly known as:  GLUCOPHAGE Take 500 mg by mouth daily with breakfast.   naproxen 500 MG tablet Commonly known as:  NAPROSYN Take 500 mg by mouth 2 (two) times daily as needed.   paliperidone 9 MG 24 hr tablet Commonly known as:  INVEGA Take 9 mg by mouth at bedtime.   pravastatin 40 MG tablet Commonly known as:  PRAVACHOL Take 40 mg by mouth at bedtime.   traZODone 50 MG tablet Commonly known as:  DESYREL Take 50 mg by mouth at bedtime.   trihexyphenidyl 5 MG tablet Commonly known as:  ARTANE Take 5 mg by mouth 2 (two) times daily with a meal.      York SpanielMonica Bernell Haynie, LCSW

## 2017-02-20 NOTE — Clinical Social Work Note (Signed)
Patient to discharge today to return to his family care home. [facility] the epic facilities but it was not available. Discharge information sent with [facility]. York SpanielMonica Lilybeth Vien MSW,LCSW [phone]

## 2017-02-20 NOTE — Progress Notes (Signed)
Patient is discharge  to a group home in a stable condition , summary and f/u care given , no new medication , verbalized understanding ,

## 2017-02-21 LAB — CULTURE, BLOOD (ROUTINE X 2)
Culture: NO GROWTH
Culture: NO GROWTH

## 2017-03-07 DIAGNOSIS — R0602 Shortness of breath: Secondary | ICD-10-CM | POA: Insufficient documentation

## 2020-04-13 ENCOUNTER — Encounter: Payer: Self-pay | Admitting: Podiatry

## 2020-04-13 ENCOUNTER — Other Ambulatory Visit: Payer: Self-pay

## 2020-04-13 ENCOUNTER — Ambulatory Visit (INDEPENDENT_AMBULATORY_CARE_PROVIDER_SITE_OTHER): Payer: Medicare Other | Admitting: Podiatry

## 2020-04-13 DIAGNOSIS — B351 Tinea unguium: Secondary | ICD-10-CM | POA: Insufficient documentation

## 2020-04-13 DIAGNOSIS — E1159 Type 2 diabetes mellitus with other circulatory complications: Secondary | ICD-10-CM

## 2020-04-13 NOTE — Progress Notes (Signed)
This patient returns to my office for at risk foot care.  This patient requires this care by a professional since this patient will be at risk due to having diabetes and coagulation defect.  Patient is taking plavix.  This patient is unable to cut nails himself since the patient cannot reach his nails.These nails are painful walking and wearing shoes.  This patient presents for at risk foot care today.  General Appearance  Alert, conversant and in no acute stress.  Vascular  Dorsalis pedis and posterior tibial  pulses are weakly/not  palpable  bilaterally.  Capillary return is within normal limits  Bilaterally.  Cold feet  B/L. bilaterally.  Neurologic  deferred  Nails Thick disfigured discolored nails with subungual debris  from hallux to fifth toes bilaterally. No evidence of bacterial infection or drainage bilaterally.  Orthopedic  No limitations of motion  feet .  No crepitus or effusions noted.  No bony pathology or digital deformities noted.  Skin  normotropic skin with no porokeratosis noted bilaterally.  No signs of infections or ulcers noted.     Onychomycosis  Pain in right toes  Pain in left toes  Consent was obtained for treatment procedures.   Mechanical debridement of nails 1-5  bilaterally performed with a nail nipper.  Filed with dremel without incident.    Return office visit    3 months                 Told patient to return for periodic foot care and evaluation due to potential at risk complications.   Helane Gunther DPM

## 2020-07-08 ENCOUNTER — Other Ambulatory Visit: Payer: Self-pay

## 2020-07-08 ENCOUNTER — Emergency Department
Admission: EM | Admit: 2020-07-08 | Discharge: 2020-07-08 | Disposition: A | Discharge status: Home / Self Care | Payer: Medicare Other | Attending: Emergency Medicine | Admitting: Emergency Medicine

## 2020-07-08 DIAGNOSIS — F17200 Nicotine dependence, unspecified, uncomplicated: Secondary | ICD-10-CM | POA: Diagnosis not present

## 2020-07-08 DIAGNOSIS — Z7951 Long term (current) use of inhaled steroids: Secondary | ICD-10-CM | POA: Insufficient documentation

## 2020-07-08 DIAGNOSIS — Z7982 Long term (current) use of aspirin: Secondary | ICD-10-CM | POA: Insufficient documentation

## 2020-07-08 DIAGNOSIS — J449 Chronic obstructive pulmonary disease, unspecified: Secondary | ICD-10-CM | POA: Insufficient documentation

## 2020-07-08 DIAGNOSIS — N3000 Acute cystitis without hematuria: Secondary | ICD-10-CM | POA: Diagnosis not present

## 2020-07-08 DIAGNOSIS — I10 Essential (primary) hypertension: Secondary | ICD-10-CM | POA: Insufficient documentation

## 2020-07-08 DIAGNOSIS — R531 Weakness: Secondary | ICD-10-CM | POA: Diagnosis present

## 2020-07-08 DIAGNOSIS — Z7984 Long term (current) use of oral hypoglycemic drugs: Secondary | ICD-10-CM | POA: Diagnosis not present

## 2020-07-08 DIAGNOSIS — E860 Dehydration: Secondary | ICD-10-CM

## 2020-07-08 DIAGNOSIS — Z79899 Other long term (current) drug therapy: Secondary | ICD-10-CM | POA: Insufficient documentation

## 2020-07-08 DIAGNOSIS — E1159 Type 2 diabetes mellitus with other circulatory complications: Secondary | ICD-10-CM | POA: Insufficient documentation

## 2020-07-08 LAB — COMPREHENSIVE METABOLIC PANEL
ALT: 9 U/L (ref 0–44)
AST: 14 U/L — ABNORMAL LOW (ref 15–41)
Albumin: 3.1 g/dL — ABNORMAL LOW (ref 3.5–5.0)
Alkaline Phosphatase: 42 U/L (ref 38–126)
Anion gap: 10 (ref 5–15)
BUN: 26 mg/dL — ABNORMAL HIGH (ref 8–23)
CO2: 27 mmol/L (ref 22–32)
Calcium: 9.3 mg/dL (ref 8.9–10.3)
Chloride: 104 mmol/L (ref 98–111)
Creatinine, Ser: 1.1 mg/dL (ref 0.61–1.24)
GFR, Estimated: >60 mL/min (ref >60)
Glucose, Bld: 129 mg/dL — ABNORMAL HIGH (ref 70–99)
Potassium: 3 mmol/L — ABNORMAL LOW (ref 3.5–5.1)
Sodium: 141 mmol/L (ref 135–145)
Total Bilirubin: 0.4 mg/dL (ref 0.3–1.2)
Total Protein: 6.2 g/dL — ABNORMAL LOW (ref 6.5–8.1)

## 2020-07-08 LAB — URINALYSIS, COMPLETE (UACMP) WITH MICROSCOPIC
Bilirubin Urine: NEGATIVE
Glucose, UA: 50 mg/dL — AB
Ketones, ur: NEGATIVE mg/dL
Nitrite: NEGATIVE
Protein, ur: NEGATIVE mg/dL
RBC / HPF: >50 RBC/hpf — ABNORMAL HIGH (ref 0–5)
Specific Gravity, Urine: 1.016 (ref 1.005–1.030)
WBC, UA: >50 WBC/hpf — ABNORMAL HIGH (ref 0–5)
pH: 5 (ref 5.0–8.0)

## 2020-07-08 LAB — CBC WITH DIFFERENTIAL/PLATELET
Abs Immature Granulocytes: 0.02 10*3/uL (ref 0.00–0.07)
Basophils Absolute: 0 10*3/uL (ref 0.0–0.1)
Basophils Relative: 0 %
Eosinophils Absolute: 0.1 10*3/uL (ref 0.0–0.5)
Eosinophils Relative: 1 %
HCT: 31 % — ABNORMAL LOW (ref 39.0–52.0)
Hemoglobin: 10.1 g/dL — ABNORMAL LOW (ref 13.0–17.0)
Immature Granulocytes: 0 %
Lymphocytes Relative: 32 %
Lymphs Abs: 1.5 10*3/uL (ref 0.7–4.0)
MCH: 31.5 pg (ref 26.0–34.0)
MCHC: 32.6 g/dL (ref 30.0–36.0)
MCV: 96.6 fL (ref 80.0–100.0)
Monocytes Absolute: 0.5 10*3/uL (ref 0.1–1.0)
Monocytes Relative: 11 %
Neutro Abs: 2.7 10*3/uL (ref 1.7–7.7)
Neutrophils Relative %: 56 %
Platelets: 157 10*3/uL (ref 150–400)
RBC: 3.21 MIL/uL — ABNORMAL LOW (ref 4.22–5.81)
RDW: 15.3 % (ref 11.5–15.5)
WBC: 4.8 10*3/uL (ref 4.0–10.5)
nRBC: 0 % (ref 0.0–0.2)

## 2020-07-08 LAB — TROPONIN I (HIGH SENSITIVITY)
Troponin I (High Sensitivity): 24 ng/L — ABNORMAL HIGH (ref <18)
Troponin I (High Sensitivity): 24 ng/L — ABNORMAL HIGH (ref <18)

## 2020-07-08 LAB — MAGNESIUM: Magnesium: 1.4 mg/dL — ABNORMAL LOW (ref 1.7–2.4)

## 2020-07-08 MED ORDER — SODIUM CHLORIDE 0.9 % IV SOLN
1.0000 g | Freq: Once | INTRAVENOUS | Status: AC
  Administered 2020-07-08: 1 g via INTRAVENOUS
  Filled 2020-07-08: qty 10

## 2020-07-08 MED ORDER — CEFDINIR 300 MG PO CAPS: 300.0000 mg | ORAL_CAPSULE | Freq: Two times a day (BID) | ORAL | 0 refills | Status: AC

## 2020-07-08 MED ORDER — SODIUM CHLORIDE 0.9 % IV BOLUS
1000.0000 mL | Freq: Once | INTRAVENOUS | Status: AC
  Administered 2020-07-08: 1000 mL via INTRAVENOUS

## 2020-07-08 NOTE — ED Notes (Signed)
[facility] [address] [place], [place] [phone]

## 2020-07-08 NOTE — ED Triage Notes (Signed)
BIBA from group home c/o weakness. Staff stated that patient c/o leg weakness and appeared shaky while sitting on porch. Arrives awake alert at baseline mental status. Ambulatory and able to transfer from stretcher to bed independently. [facility] 157

## 2020-07-08 NOTE — ED Notes (Signed)
Pt repositioned in bed by this tech and Olivia EDT.

## 2020-07-08 NOTE — ED Notes (Signed)
DC reviewed with Burna Mortimer at [facility]. Plan to send ride for patient soon.
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
## 2020-07-10 LAB — URINE CULTURE: Culture: >=100000 — AB

## 2020-07-20 ENCOUNTER — Ambulatory Visit: Payer: Medicare Other | Admitting: Podiatry

## 2020-07-23 ENCOUNTER — Encounter: Payer: Self-pay | Admitting: Podiatry

## 2020-07-23 ENCOUNTER — Other Ambulatory Visit: Payer: Self-pay

## 2020-07-23 ENCOUNTER — Ambulatory Visit: Payer: Medicare Other | Admitting: Podiatry

## 2020-07-23 ENCOUNTER — Ambulatory Visit (INDEPENDENT_AMBULATORY_CARE_PROVIDER_SITE_OTHER): Payer: Medicare Other | Admitting: Podiatry

## 2020-07-23 DIAGNOSIS — E1159 Type 2 diabetes mellitus with other circulatory complications: Secondary | ICD-10-CM

## 2020-07-23 DIAGNOSIS — B351 Tinea unguium: Secondary | ICD-10-CM

## 2020-07-23 NOTE — Progress Notes (Signed)
This patient returns to my office for at risk foot care.  This patient requires this care by a professional since this patient will be at risk due to having diabetes and coagulation defect.  Patient is taking plavix.  This patient is unable to cut nails himself since the patient cannot reach his nails.These nails are painful walking and wearing shoes.  Patient is brought by his caregiver. This patient presents for at risk foot care today.  General Appearance  Alert, conversant and in no acute stress.  Vascular  Dorsalis pedis and posterior tibial  pulses are weakly/not  palpable  bilaterally.  Capillary return is within normal limits  Bilaterally.  Cold feet  B/L. bilaterally.  Neurologic  deferred  Nails Thick disfigured discolored nails with subungual debris  from hallux to fifth toes bilaterally. No evidence of bacterial infection or drainage bilaterally.  Orthopedic  No limitations of motion  feet .  No crepitus or effusions noted.  No bony pathology or digital deformities noted.  Skin  normotropic skin with no porokeratosis noted bilaterally.  No signs of infections or ulcers noted.     Onychomycosis  Pain in right toes  Pain in left toes  Consent was obtained for treatment procedures.   Mechanical debridement of nails 1-5  bilaterally performed with a nail nipper.  Filed with dremel without incident.    Return office visit    4 months                 Told patient to return for periodic foot care and evaluation due to potential at risk complications.   Marygrace Sandoval DPM  

## 2020-10-01 ENCOUNTER — Emergency Department
Admission: EM | Admit: 2020-10-01 | Discharge: 2020-10-02 | Disposition: A | Discharge status: Home / Self Care | Payer: Medicare Other | Attending: Emergency Medicine | Admitting: Emergency Medicine

## 2020-10-01 ENCOUNTER — Other Ambulatory Visit: Payer: Self-pay

## 2020-10-01 DIAGNOSIS — T50901A Poisoning by unspecified drugs, medicaments and biological substances, accidental (unintentional), initial encounter: Secondary | ICD-10-CM

## 2020-10-01 DIAGNOSIS — Z7982 Long term (current) use of aspirin: Secondary | ICD-10-CM | POA: Insufficient documentation

## 2020-10-01 DIAGNOSIS — F17200 Nicotine dependence, unspecified, uncomplicated: Secondary | ICD-10-CM | POA: Diagnosis not present

## 2020-10-01 DIAGNOSIS — Z7951 Long term (current) use of inhaled steroids: Secondary | ICD-10-CM | POA: Insufficient documentation

## 2020-10-01 DIAGNOSIS — F1027 Alcohol dependence with alcohol-induced persisting dementia: Secondary | ICD-10-CM | POA: Insufficient documentation

## 2020-10-01 DIAGNOSIS — Z7984 Long term (current) use of oral hypoglycemic drugs: Secondary | ICD-10-CM | POA: Diagnosis not present

## 2020-10-01 DIAGNOSIS — E119 Type 2 diabetes mellitus without complications: Secondary | ICD-10-CM | POA: Insufficient documentation

## 2020-10-01 DIAGNOSIS — J449 Chronic obstructive pulmonary disease, unspecified: Secondary | ICD-10-CM | POA: Diagnosis not present

## 2020-10-01 DIAGNOSIS — Z79899 Other long term (current) drug therapy: Secondary | ICD-10-CM | POA: Insufficient documentation

## 2020-10-01 DIAGNOSIS — Z7902 Long term (current) use of antithrombotics/antiplatelets: Secondary | ICD-10-CM | POA: Insufficient documentation

## 2020-10-01 DIAGNOSIS — I10 Essential (primary) hypertension: Secondary | ICD-10-CM | POA: Insufficient documentation

## 2020-10-01 LAB — CBC WITH DIFFERENTIAL/PLATELET
Abs Immature Granulocytes: 0.01 10*3/uL (ref 0.00–0.07)
Basophils Absolute: 0 10*3/uL (ref 0.0–0.1)
Basophils Relative: 1 %
Eosinophils Absolute: 0.1 10*3/uL (ref 0.0–0.5)
Eosinophils Relative: 3 %
HCT: 39.7 % (ref 39.0–52.0)
Hemoglobin: 12.9 g/dL — ABNORMAL LOW (ref 13.0–17.0)
Immature Granulocytes: 0 %
Lymphocytes Relative: 35 %
Lymphs Abs: 1.6 10*3/uL (ref 0.7–4.0)
MCH: 31 pg (ref 26.0–34.0)
MCHC: 32.5 g/dL (ref 30.0–36.0)
MCV: 95.4 fL (ref 80.0–100.0)
Monocytes Absolute: 0.5 10*3/uL (ref 0.1–1.0)
Monocytes Relative: 12 %
Neutro Abs: 2.2 10*3/uL (ref 1.7–7.7)
Neutrophils Relative %: 49 %
Platelets: 177 10*3/uL (ref 150–400)
RBC: 4.16 MIL/uL — ABNORMAL LOW (ref 4.22–5.81)
RDW: 14 % (ref 11.5–15.5)
WBC: 4.4 10*3/uL (ref 4.0–10.5)
nRBC: 0 % (ref 0.0–0.2)

## 2020-10-01 LAB — COMPREHENSIVE METABOLIC PANEL
ALT: 11 U/L (ref 0–44)
AST: 16 U/L (ref 15–41)
Albumin: 3.9 g/dL (ref 3.5–5.0)
Alkaline Phosphatase: 61 U/L (ref 38–126)
Anion gap: 9 (ref 5–15)
BUN: 26 mg/dL — ABNORMAL HIGH (ref 8–23)
CO2: 30 mmol/L (ref 22–32)
Calcium: 10.2 mg/dL (ref 8.9–10.3)
Chloride: 99 mmol/L (ref 98–111)
Creatinine, Ser: 1.12 mg/dL (ref 0.61–1.24)
GFR, Estimated: >60 mL/min (ref >60)
Glucose, Bld: 124 mg/dL — ABNORMAL HIGH (ref 70–99)
Potassium: 3.8 mmol/L (ref 3.5–5.1)
Sodium: 138 mmol/L (ref 135–145)
Total Bilirubin: 0.4 mg/dL (ref 0.3–1.2)
Total Protein: 8.3 g/dL — ABNORMAL HIGH (ref 6.5–8.1)

## 2020-10-01 LAB — ACETAMINOPHEN LEVEL: Acetaminophen (Tylenol), Serum: <10 ug/mL — ABNORMAL LOW (ref 10–30)

## 2020-10-01 LAB — LITHIUM LEVEL: Lithium Lvl: <0.06 mmol/L — ABNORMAL LOW (ref 0.60–1.20)

## 2020-10-01 LAB — MAGNESIUM: Magnesium: 1.5 mg/dL — ABNORMAL LOW (ref 1.7–2.4)

## 2020-10-01 LAB — SALICYLATE LEVEL: Salicylate Lvl: <7 mg/dL — ABNORMAL LOW (ref 7.0–30.0)

## 2020-10-01 MED ORDER — MAGNESIUM OXIDE 400 (241.3 MG) MG PO TABS
800.0000 mg | ORAL_TABLET | Freq: Once | ORAL | Status: AC
  Administered 2020-10-01: 800 mg via ORAL
  Filled 2020-10-01: qty 2

## 2020-10-01 NOTE — ED Notes (Signed)
Poison control RN called this RN back. This RN reported lithium, acetaminophen, and salicylate levels. This RN also review repeat EKG. Poison control RN stated that pt can be discharged. Don Perking,  MD notified

## 2020-10-01 NOTE — ED Notes (Signed)
This RN communicated with poison control about the medications that the pt was given and the medications that the pt normally takes. This RN also told Poison control RN about pt most recent vital signs and EKG. Poison control RN stated to watch out low BP, QTC.QRS interval, seizures, etc.

## 2020-10-01 NOTE — ED Triage Notes (Addendum)
Pt to ED via [facility] from [facility] in [place], [place]. Per EMS, pt was given the wrong medications. Pt is confused at baseline.
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
## 2020-10-02 NOTE — ED Notes (Signed)
Verbal consent given for transport

## 2020-10-02 NOTE — ED Notes (Signed)
Gave report to [place] from [facility].

## 2020-10-02 NOTE — ED Provider Notes (Signed)
Lithium level is undetected.  Patient cleared by poison control.  Remains at baseline with no signs of sedation.  Will discharge back to the nursing home per plan established by Dr. Rosezella Rumpf, [place], MD 10/02/20 0004

## 2020-10-05 ENCOUNTER — Other Ambulatory Visit: Payer: Self-pay

## 2020-10-05 ENCOUNTER — Encounter: Payer: Self-pay | Admitting: Emergency Medicine

## 2020-10-05 ENCOUNTER — Emergency Department: Payer: Medicare Other

## 2020-10-05 ENCOUNTER — Emergency Department
Admission: EM | Admit: 2020-10-05 | Discharge: 2020-10-05 | Disposition: A | Discharge status: Home / Self Care | Payer: Medicare Other | Attending: Emergency Medicine | Admitting: Emergency Medicine

## 2020-10-05 DIAGNOSIS — I10 Essential (primary) hypertension: Secondary | ICD-10-CM | POA: Insufficient documentation

## 2020-10-05 DIAGNOSIS — E1159 Type 2 diabetes mellitus with other circulatory complications: Secondary | ICD-10-CM | POA: Insufficient documentation

## 2020-10-05 DIAGNOSIS — F17200 Nicotine dependence, unspecified, uncomplicated: Secondary | ICD-10-CM | POA: Insufficient documentation

## 2020-10-05 DIAGNOSIS — Z79899 Other long term (current) drug therapy: Secondary | ICD-10-CM | POA: Diagnosis not present

## 2020-10-05 DIAGNOSIS — E860 Dehydration: Secondary | ICD-10-CM

## 2020-10-05 DIAGNOSIS — J449 Chronic obstructive pulmonary disease, unspecified: Secondary | ICD-10-CM | POA: Insufficient documentation

## 2020-10-05 DIAGNOSIS — Z7982 Long term (current) use of aspirin: Secondary | ICD-10-CM | POA: Diagnosis not present

## 2020-10-05 DIAGNOSIS — E119 Type 2 diabetes mellitus without complications: Secondary | ICD-10-CM | POA: Diagnosis not present

## 2020-10-05 DIAGNOSIS — Z7984 Long term (current) use of oral hypoglycemic drugs: Secondary | ICD-10-CM | POA: Insufficient documentation

## 2020-10-05 DIAGNOSIS — M79605 Pain in left leg: Secondary | ICD-10-CM | POA: Insufficient documentation

## 2020-10-05 LAB — TROPONIN I (HIGH SENSITIVITY)
Troponin I (High Sensitivity): 27 ng/L — ABNORMAL HIGH (ref <18)
Troponin I (High Sensitivity): 37 ng/L — ABNORMAL HIGH (ref <18)

## 2020-10-05 LAB — CBC
HCT: 36.5 % — ABNORMAL LOW (ref 39.0–52.0)
Hemoglobin: 11.8 g/dL — ABNORMAL LOW (ref 13.0–17.0)
MCH: 30.8 pg (ref 26.0–34.0)
MCHC: 32.3 g/dL (ref 30.0–36.0)
MCV: 95.3 fL (ref 80.0–100.0)
Platelets: 171 10*3/uL (ref 150–400)
RBC: 3.83 MIL/uL — ABNORMAL LOW (ref 4.22–5.81)
RDW: 14.3 % (ref 11.5–15.5)
WBC: 4.5 10*3/uL (ref 4.0–10.5)
nRBC: 0 % (ref 0.0–0.2)

## 2020-10-05 LAB — COMPREHENSIVE METABOLIC PANEL
ALT: 9 U/L (ref 0–44)
AST: 17 U/L (ref 15–41)
Albumin: 3.6 g/dL (ref 3.5–5.0)
Alkaline Phosphatase: 53 U/L (ref 38–126)
Anion gap: 13 (ref 5–15)
BUN: 35 mg/dL — ABNORMAL HIGH (ref 8–23)
CO2: 27 mmol/L (ref 22–32)
Calcium: 10.1 mg/dL (ref 8.9–10.3)
Chloride: 99 mmol/L (ref 98–111)
Creatinine, Ser: 1.57 mg/dL — ABNORMAL HIGH (ref 0.61–1.24)
GFR, Estimated: 45 mL/min — ABNORMAL LOW (ref >60)
Glucose, Bld: 188 mg/dL — ABNORMAL HIGH (ref 70–99)
Potassium: 3.4 mmol/L — ABNORMAL LOW (ref 3.5–5.1)
Sodium: 139 mmol/L (ref 135–145)
Total Bilirubin: 0.5 mg/dL (ref 0.3–1.2)
Total Protein: 7.4 g/dL (ref 6.5–8.1)

## 2020-10-05 LAB — URINALYSIS, COMPLETE (UACMP) WITH MICROSCOPIC
Bacteria, UA: NONE SEEN
Bilirubin Urine: NEGATIVE
Glucose, UA: NEGATIVE mg/dL
Hgb urine dipstick: NEGATIVE
Ketones, ur: NEGATIVE mg/dL
Leukocytes,Ua: NEGATIVE
Nitrite: NEGATIVE
Protein, ur: NEGATIVE mg/dL
Specific Gravity, Urine: 1.014 (ref 1.005–1.030)
Squamous Epithelial / HPF: NONE SEEN (ref 0–5)
pH: 6 (ref 5.0–8.0)

## 2020-10-05 LAB — CK: Total CK: 40 U/L — ABNORMAL LOW (ref 49–397)

## 2020-10-05 LAB — LACTIC ACID, PLASMA: Lactic Acid, Venous: 2 mmol/L (ref 0.5–1.9)

## 2020-10-05 MED ORDER — ACETAMINOPHEN 500 MG PO TABS
1000.0000 mg | ORAL_TABLET | ORAL | Status: AC
  Administered 2020-10-05: 1000 mg via ORAL

## 2020-10-05 MED ORDER — SODIUM CHLORIDE 0.9 % IV BOLUS
1000.0000 mL | Freq: Once | INTRAVENOUS | Status: AC
  Administered 2020-10-05: 1000 mL via INTRAVENOUS

## 2020-10-05 NOTE — ED Notes (Signed)
Pt ambulated 50 feet no device and steady gait
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
## 2020-10-05 NOTE — ED Triage Notes (Signed)
Pt in from group home via [facility] w/complaints of bilateral thigh pain x 3 days. States he cannot walk. Pt himself a poor historian, but denies any other complaints.

## 2020-10-05 NOTE — ED Notes (Signed)
[phone] facility [facility]?

## 2020-10-05 NOTE — ED Triage Notes (Signed)
First Nurse Note:  Arrives from [facility] group home via [facility] (paperwork with facility number with chart).  C/o bilateral leg pain since last night.  Hurts when he walks.  VS wnl.  NAD

## 2020-10-05 NOTE — ED Provider Notes (Signed)
-----------------------------------------   4:52 PM on 10/05/2020 -----------------------------------------  Patient care assumed from Dr. Fanny Bien.  Overall patient appears well.  Repeat troponin very minimally elevated at 37 which goes along with the patient's chronic troponin elevation.  Patient's lab work most consistent with dehydration, has received a liter of fluid.  Vital signs are reassuring.  I did Doppler the patient's pulses to his lower extremities as I was unable to palpate DP pulses bilaterally but the patient does have dopplerable pulses bilaterally DP and PT.   Patient has been able to ambulate in the emergency department without issue, highly suspect patient's pain is chronic, could represent sciatica possible claudication.  We will refer to vascular surgery for further evaluation.  Patient agreeable   Minna Antis, MD 10/05/20 1655

## 2020-10-05 NOTE — ED Provider Notes (Signed)
Of note, patient's perfusion improved.  Warm to touch all extremities, feeling improved.  Resting pleasantly at this time.   Sharyn Creamer, MD 10/05/20 [phone]

## 2020-10-05 NOTE — ED Notes (Addendum)
Called group home [phone]), per staff pt normally ambulatory w/o assist, at baseline of usual a&ox2-3. Staff said yesterday, pt thinks he slept wrong while napping, has had pain since then

## 2020-10-05 NOTE — Discharge Instructions (Addendum)
Please follow-up with your doctor as well as vascular surgery by calling the number provided for further evaluation.  Return to the emergency department for any worsening pain, or any other symptom personally concerning to yourself.

## 2020-10-15 ENCOUNTER — Other Ambulatory Visit: Payer: Self-pay

## 2020-10-15 ENCOUNTER — Emergency Department
Admission: EM | Admit: 2020-10-15 | Discharge: 2020-10-15 | Disposition: A | Discharge status: Home / Self Care | Payer: Medicare Other | Attending: Emergency Medicine | Admitting: Emergency Medicine

## 2020-10-15 ENCOUNTER — Emergency Department: Payer: Medicare Other

## 2020-10-15 DIAGNOSIS — Z7982 Long term (current) use of aspirin: Secondary | ICD-10-CM | POA: Insufficient documentation

## 2020-10-15 DIAGNOSIS — R770 Abnormality of albumin: Secondary | ICD-10-CM | POA: Insufficient documentation

## 2020-10-15 DIAGNOSIS — Z7902 Long term (current) use of antithrombotics/antiplatelets: Secondary | ICD-10-CM | POA: Diagnosis not present

## 2020-10-15 DIAGNOSIS — I10 Essential (primary) hypertension: Secondary | ICD-10-CM | POA: Insufficient documentation

## 2020-10-15 DIAGNOSIS — Z79899 Other long term (current) drug therapy: Secondary | ICD-10-CM | POA: Insufficient documentation

## 2020-10-15 DIAGNOSIS — J449 Chronic obstructive pulmonary disease, unspecified: Secondary | ICD-10-CM | POA: Insufficient documentation

## 2020-10-15 DIAGNOSIS — E612 Magnesium deficiency: Secondary | ICD-10-CM | POA: Insufficient documentation

## 2020-10-15 DIAGNOSIS — E119 Type 2 diabetes mellitus without complications: Secondary | ICD-10-CM | POA: Diagnosis not present

## 2020-10-15 DIAGNOSIS — F17200 Nicotine dependence, unspecified, uncomplicated: Secondary | ICD-10-CM | POA: Insufficient documentation

## 2020-10-15 DIAGNOSIS — R531 Weakness: Secondary | ICD-10-CM

## 2020-10-15 DIAGNOSIS — Z7984 Long term (current) use of oral hypoglycemic drugs: Secondary | ICD-10-CM | POA: Insufficient documentation

## 2020-10-15 LAB — CBC
HCT: 36 % — ABNORMAL LOW (ref 39.0–52.0)
Hemoglobin: 12 g/dL — ABNORMAL LOW (ref 13.0–17.0)
MCH: 31.3 pg (ref 26.0–34.0)
MCHC: 33.3 g/dL (ref 30.0–36.0)
MCV: 94 fL (ref 80.0–100.0)
Platelets: 192 10*3/uL (ref 150–400)
RBC: 3.83 MIL/uL — ABNORMAL LOW (ref 4.22–5.81)
RDW: 13.9 % (ref 11.5–15.5)
WBC: 5 10*3/uL (ref 4.0–10.5)
nRBC: 0 % (ref 0.0–0.2)

## 2020-10-15 LAB — BASIC METABOLIC PANEL
Anion gap: 5 (ref 5–15)
BUN: 19 mg/dL (ref 8–23)
CO2: 25 mmol/L (ref 22–32)
Calcium: 7.5 mg/dL — ABNORMAL LOW (ref 8.9–10.3)
Chloride: 107 mmol/L (ref 98–111)
Creatinine, Ser: 0.95 mg/dL (ref 0.61–1.24)
GFR, Estimated: >60 mL/min (ref >60)
Glucose, Bld: 94 mg/dL (ref 70–99)
Potassium: 3.9 mmol/L (ref 3.5–5.1)
Sodium: 137 mmol/L (ref 135–145)

## 2020-10-15 LAB — HEPATIC FUNCTION PANEL
ALT: 10 U/L (ref 0–44)
AST: 17 U/L (ref 15–41)
Albumin: 3.1 g/dL — ABNORMAL LOW (ref 3.5–5.0)
Alkaline Phosphatase: 47 U/L (ref 38–126)
Bilirubin, Direct: 0.2 mg/dL (ref 0.0–0.2)
Indirect Bilirubin: 0.7 mg/dL (ref 0.3–0.9)
Total Bilirubin: 0.9 mg/dL (ref 0.3–1.2)
Total Protein: 6.1 g/dL — ABNORMAL LOW (ref 6.5–8.1)

## 2020-10-15 LAB — MAGNESIUM: Magnesium: 1.3 mg/dL — ABNORMAL LOW (ref 1.7–2.4)

## 2020-10-15 MED ORDER — SODIUM CHLORIDE 0.9 % IV SOLN
1000.0000 mL | Freq: Once | INTRAVENOUS | Status: AC
  Administered 2020-10-15: 1000 mL via INTRAVENOUS

## 2020-10-15 NOTE — ED Notes (Signed)
Terrence Johnson with [facility] contacted this RN for an update regarding pt. Terrence Johnson states if pt is d/c contact her at [phone] for transportation

## 2020-10-15 NOTE — ED Notes (Signed)
Pt given urinal to provide urine sample

## 2020-10-15 NOTE — ED Notes (Signed)
Brief report given to [name] at [facility]. Terrence Johnson also states she will arrange transportation, This RN noted to [place] pt would strongly benefit from a walker Terrence Johnson verbalized understanding.

## 2020-10-15 NOTE — ED Triage Notes (Signed)
Pt arrives via ems from [facility] for unsteady gait. Facility reported pt was having trouble walking. Equal grip and leg strength. Pt a&o to self and year, and situation. NAD noted at this time   Ems- cbg of 147 bp 170/90

## 2020-10-15 NOTE — ED Notes (Signed)
Pt here to be transported home by his facility staff.

## 2020-10-15 NOTE — Discharge Instructions (Signed)
Patient was given IV fluids, lab work is reassuring, he seems to be at his baseline
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
## 2020-10-15 NOTE — ED Notes (Signed)
Pt ambulatory in room with this RN and [employer] with use of walker. Pt noted to have steady gait when using walker, states he doesn't have one of these at home. Pt incontinent of urine, brief placed and pt assisted into new pants.

## 2020-10-16 LAB — CALCIUM, IONIZED: Calcium, Ionized, Serum: 5 mg/dL (ref 4.5–5.6)

## 2020-11-23 ENCOUNTER — Encounter: Payer: Self-pay | Admitting: Podiatry

## 2020-11-23 ENCOUNTER — Ambulatory Visit (INDEPENDENT_AMBULATORY_CARE_PROVIDER_SITE_OTHER): Payer: Medicare Other | Admitting: Podiatry

## 2020-11-23 ENCOUNTER — Other Ambulatory Visit: Payer: Self-pay

## 2020-11-23 DIAGNOSIS — B351 Tinea unguium: Secondary | ICD-10-CM

## 2020-11-23 DIAGNOSIS — E1159 Type 2 diabetes mellitus with other circulatory complications: Secondary | ICD-10-CM | POA: Diagnosis not present

## 2020-11-23 NOTE — Progress Notes (Signed)
This patient returns to my office for at risk foot care.  This patient requires this care by a professional since this patient will be at risk due to having diabetes and coagulation defect.  Patient is taking plavix.  This patient is unable to cut nails himself since the patient cannot reach his nails.These nails are painful walking and wearing shoes.  Patient is brought by his caregiver. This patient presents for at risk foot care today.  General Appearance  Alert, conversant and in no acute stress.  Vascular  Dorsalis pedis and posterior tibial  pulses are weakly/not  palpable  bilaterally.  Capillary return is within normal limits  Bilaterally.  Cold feet  B/L. bilaterally.  Neurologic  deferred  Nails Thick disfigured discolored nails with subungual debris  from hallux to fifth toes bilaterally. No evidence of bacterial infection or drainage bilaterally.  Orthopedic  No limitations of motion  feet .  No crepitus or effusions noted.  No bony pathology or digital deformities noted.  Skin  normotropic skin with no porokeratosis noted bilaterally.  No signs of infections or ulcers noted.     Onychomycosis  Pain in right toes  Pain in left toes  Consent was obtained for treatment procedures.   Mechanical debridement of nails 1-5  bilaterally performed with a nail nipper.  Filed with dremel without incident.    Return office visit    4 months                 Told patient to return for periodic foot care and evaluation due to potential at risk complications.   Helane Gunther DPM

## 2020-12-15 ENCOUNTER — Other Ambulatory Visit: Payer: Self-pay

## 2020-12-15 ENCOUNTER — Inpatient Hospital Stay: Payer: Medicare Other

## 2020-12-15 ENCOUNTER — Emergency Department: Payer: Medicare Other

## 2020-12-15 ENCOUNTER — Encounter: Payer: Self-pay | Admitting: Emergency Medicine

## 2020-12-15 ENCOUNTER — Inpatient Hospital Stay
Admission: EM | Admit: 2020-12-15 | Discharge: 2020-12-18 | DRG: 280 | Disposition: A | Discharge status: Skilled Nursing Facility | Payer: Medicare Other | Source: Skilled Nursing Facility | Attending: Internal Medicine | Admitting: Internal Medicine

## 2020-12-15 ENCOUNTER — Inpatient Hospital Stay
Admit: 2020-12-15 | Discharge: 2020-12-15 | Disposition: A | Discharge status: Home / Self Care | Payer: Medicare Other | Attending: Cardiovascular Disease | Admitting: Cardiovascular Disease

## 2020-12-15 DIAGNOSIS — Z66 Do not resuscitate: Secondary | ICD-10-CM | POA: Diagnosis present

## 2020-12-15 DIAGNOSIS — F17210 Nicotine dependence, cigarettes, uncomplicated: Secondary | ICD-10-CM | POA: Diagnosis present

## 2020-12-15 DIAGNOSIS — R4189 Other symptoms and signs involving cognitive functions and awareness: Secondary | ICD-10-CM | POA: Diagnosis present

## 2020-12-15 DIAGNOSIS — N189 Chronic kidney disease, unspecified: Secondary | ICD-10-CM

## 2020-12-15 DIAGNOSIS — Z7982 Long term (current) use of aspirin: Secondary | ICD-10-CM

## 2020-12-15 DIAGNOSIS — I10 Essential (primary) hypertension: Secondary | ICD-10-CM | POA: Diagnosis not present

## 2020-12-15 DIAGNOSIS — N1831 Chronic kidney disease, stage 3a: Secondary | ICD-10-CM | POA: Diagnosis present

## 2020-12-15 DIAGNOSIS — I2510 Atherosclerotic heart disease of native coronary artery without angina pectoris: Secondary | ICD-10-CM | POA: Diagnosis present

## 2020-12-15 DIAGNOSIS — R531 Weakness: Secondary | ICD-10-CM

## 2020-12-15 DIAGNOSIS — J181 Lobar pneumonia, unspecified organism: Secondary | ICD-10-CM | POA: Diagnosis present

## 2020-12-15 DIAGNOSIS — Z79899 Other long term (current) drug therapy: Secondary | ICD-10-CM | POA: Diagnosis not present

## 2020-12-15 DIAGNOSIS — W19XXXA Unspecified fall, initial encounter: Secondary | ICD-10-CM | POA: Diagnosis present

## 2020-12-15 DIAGNOSIS — N182 Chronic kidney disease, stage 2 (mild): Secondary | ICD-10-CM

## 2020-12-15 DIAGNOSIS — E785 Hyperlipidemia, unspecified: Secondary | ICD-10-CM | POA: Diagnosis present

## 2020-12-15 DIAGNOSIS — E876 Hypokalemia: Secondary | ICD-10-CM

## 2020-12-15 DIAGNOSIS — R4781 Slurred speech: Secondary | ICD-10-CM | POA: Diagnosis present

## 2020-12-15 DIAGNOSIS — Z20822 Contact with and (suspected) exposure to covid-19: Secondary | ICD-10-CM | POA: Diagnosis present

## 2020-12-15 DIAGNOSIS — I214 Non-ST elevation (NSTEMI) myocardial infarction: Secondary | ICD-10-CM | POA: Diagnosis present

## 2020-12-15 DIAGNOSIS — F1097 Alcohol use, unspecified with alcohol-induced persisting dementia: Secondary | ICD-10-CM | POA: Diagnosis present

## 2020-12-15 DIAGNOSIS — N179 Acute kidney failure, unspecified: Secondary | ICD-10-CM | POA: Diagnosis present

## 2020-12-15 DIAGNOSIS — Z7984 Long term (current) use of oral hypoglycemic drugs: Secondary | ICD-10-CM

## 2020-12-15 DIAGNOSIS — E7800 Pure hypercholesterolemia, unspecified: Secondary | ICD-10-CM | POA: Diagnosis present

## 2020-12-15 DIAGNOSIS — J440 Chronic obstructive pulmonary disease with acute lower respiratory infection: Secondary | ICD-10-CM | POA: Diagnosis present

## 2020-12-15 DIAGNOSIS — F209 Schizophrenia, unspecified: Secondary | ICD-10-CM

## 2020-12-15 DIAGNOSIS — E1129 Type 2 diabetes mellitus with other diabetic kidney complication: Secondary | ICD-10-CM | POA: Diagnosis present

## 2020-12-15 DIAGNOSIS — E1169 Type 2 diabetes mellitus with other specified complication: Secondary | ICD-10-CM | POA: Diagnosis present

## 2020-12-15 DIAGNOSIS — Y929 Unspecified place or not applicable: Secondary | ICD-10-CM

## 2020-12-15 DIAGNOSIS — I129 Hypertensive chronic kidney disease with stage 1 through stage 4 chronic kidney disease, or unspecified chronic kidney disease: Secondary | ICD-10-CM | POA: Diagnosis present

## 2020-12-15 DIAGNOSIS — J449 Chronic obstructive pulmonary disease, unspecified: Secondary | ICD-10-CM

## 2020-12-15 DIAGNOSIS — Z8249 Family history of ischemic heart disease and other diseases of the circulatory system: Secondary | ICD-10-CM | POA: Diagnosis not present

## 2020-12-15 DIAGNOSIS — Z7902 Long term (current) use of antithrombotics/antiplatelets: Secondary | ICD-10-CM | POA: Diagnosis not present

## 2020-12-15 DIAGNOSIS — E1122 Type 2 diabetes mellitus with diabetic chronic kidney disease: Secondary | ICD-10-CM

## 2020-12-15 DIAGNOSIS — N183 Chronic kidney disease, stage 3 unspecified: Secondary | ICD-10-CM | POA: Diagnosis present

## 2020-12-15 DIAGNOSIS — E860 Dehydration: Secondary | ICD-10-CM | POA: Diagnosis present

## 2020-12-15 LAB — ECHOCARDIOGRAM COMPLETE
AR max vel: 1.74 cm2
AV Area VTI: 2.04 cm2
AV Area mean vel: 1.9 cm2
AV Mean grad: 1 mmHg
AV Peak grad: 2.9 mmHg
Ao pk vel: 0.85 m/s
Area-P 1/2: 3.77 cm2
S' Lateral: 2.85 cm
Weight: 2240 oz

## 2020-12-15 LAB — CBG MONITORING, ED
Glucose-Capillary: 127 mg/dL — ABNORMAL HIGH (ref 70–99)
Glucose-Capillary: 103 mg/dL — ABNORMAL HIGH (ref 70–99)

## 2020-12-15 LAB — CBC WITH DIFFERENTIAL/PLATELET
Abs Immature Granulocytes: 0.02 10*3/uL (ref 0.00–0.07)
Basophils Absolute: 0 10*3/uL (ref 0.0–0.1)
Basophils Relative: 0 %
Eosinophils Absolute: 0.1 10*3/uL (ref 0.0–0.5)
Eosinophils Relative: 2 %
HCT: 38.7 % — ABNORMAL LOW (ref 39.0–52.0)
Hemoglobin: 12.7 g/dL — ABNORMAL LOW (ref 13.0–17.0)
Immature Granulocytes: 0 %
Lymphocytes Relative: 27 %
Lymphs Abs: 1.5 10*3/uL (ref 0.7–4.0)
MCH: 31.3 pg (ref 26.0–34.0)
MCHC: 32.8 g/dL (ref 30.0–36.0)
MCV: 95.3 fL (ref 80.0–100.0)
Monocytes Absolute: 0.6 10*3/uL (ref 0.1–1.0)
Monocytes Relative: 12 %
Neutro Abs: 3.2 10*3/uL (ref 1.7–7.7)
Neutrophils Relative %: 59 %
Platelets: 165 10*3/uL (ref 150–400)
RBC: 4.06 MIL/uL — ABNORMAL LOW (ref 4.22–5.81)
RDW: 14.6 % (ref 11.5–15.5)
WBC: 5.4 10*3/uL (ref 4.0–10.5)
nRBC: 0 % (ref 0.0–0.2)

## 2020-12-15 LAB — MRSA PCR SCREENING: MRSA by PCR: NEGATIVE

## 2020-12-15 LAB — BRAIN NATRIURETIC PEPTIDE: B Natriuretic Peptide: 80.1 pg/mL (ref 0.0–100.0)

## 2020-12-15 LAB — COMPREHENSIVE METABOLIC PANEL
ALT: 10 U/L (ref 0–44)
AST: 22 U/L (ref 15–41)
Albumin: 3.9 g/dL (ref 3.5–5.0)
Alkaline Phosphatase: 54 U/L (ref 38–126)
Anion gap: 10 (ref 5–15)
BUN: 23 mg/dL (ref 8–23)
CO2: 29 mmol/L (ref 22–32)
Calcium: 9.8 mg/dL (ref 8.9–10.3)
Chloride: 99 mmol/L (ref 98–111)
Creatinine, Ser: 1.32 mg/dL — ABNORMAL HIGH (ref 0.61–1.24)
GFR, Estimated: 55 mL/min — ABNORMAL LOW (ref >60)
Glucose, Bld: 124 mg/dL — ABNORMAL HIGH (ref 70–99)
Potassium: 3.6 mmol/L (ref 3.5–5.1)
Sodium: 138 mmol/L (ref 135–145)
Total Bilirubin: 0.7 mg/dL (ref 0.3–1.2)
Total Protein: 7.6 g/dL (ref 6.5–8.1)

## 2020-12-15 LAB — URINE DRUG SCREEN, QUALITATIVE (ARMC ONLY)
Amphetamines, Ur Screen: NOT DETECTED
Barbiturates, Ur Screen: NOT DETECTED
Benzodiazepine, Ur Scrn: NOT DETECTED
Cannabinoid 50 Ng, Ur ~~LOC~~: NOT DETECTED
Cocaine Metabolite,Ur ~~LOC~~: NOT DETECTED
MDMA (Ecstasy)Ur Screen: NOT DETECTED
Methadone Scn, Ur: NOT DETECTED
Opiate, Ur Screen: NOT DETECTED
Phencyclidine (PCP) Ur S: NOT DETECTED
Tricyclic, Ur Screen: NOT DETECTED

## 2020-12-15 LAB — URINALYSIS, COMPLETE (UACMP) WITH MICROSCOPIC
Bacteria, UA: NONE SEEN
Bilirubin Urine: NEGATIVE
Glucose, UA: >=500 mg/dL — AB
Hgb urine dipstick: NEGATIVE
Ketones, ur: NEGATIVE mg/dL
Leukocytes,Ua: NEGATIVE
Nitrite: NEGATIVE
Protein, ur: NEGATIVE mg/dL
Specific Gravity, Urine: 1.013 (ref 1.005–1.030)
pH: 7 (ref 5.0–8.0)

## 2020-12-15 LAB — GLUCOSE, CAPILLARY
Glucose-Capillary: 127 mg/dL — ABNORMAL HIGH (ref 70–99)
Glucose-Capillary: 151 mg/dL — ABNORMAL HIGH (ref 70–99)

## 2020-12-15 LAB — PROTIME-INR
INR: 1.1 (ref 0.8–1.2)
Prothrombin Time: 14.2 seconds (ref 11.4–15.2)

## 2020-12-15 LAB — TROPONIN I (HIGH SENSITIVITY)
Troponin I (High Sensitivity): 1245 ng/L (ref <18)
Troponin I (High Sensitivity): 1084 ng/L (ref <18)
Troponin I (High Sensitivity): 1141 ng/L (ref <18)
Troponin I (High Sensitivity): 900 ng/L (ref <18)

## 2020-12-15 LAB — HEPARIN LEVEL (UNFRACTIONATED): Heparin Unfractionated: 0.78 IU/mL — ABNORMAL HIGH (ref 0.30–0.70)

## 2020-12-15 LAB — RESP PANEL BY RT-PCR (FLU A&B, COVID) ARPGX2
Influenza A by PCR: NEGATIVE
Influenza B by PCR: NEGATIVE
SARS Coronavirus 2 by RT PCR: NEGATIVE

## 2020-12-15 LAB — AMMONIA: Ammonia: <9 umol/L — ABNORMAL LOW (ref 9–35)

## 2020-12-15 LAB — TSH: TSH: 3.627 u[IU]/mL (ref 0.350–4.500)

## 2020-12-15 LAB — APTT: aPTT: 28 seconds (ref 24–36)

## 2020-12-15 MED ORDER — BENZTROPINE MESYLATE 0.5 MG PO TABS
0.5000 mg | ORAL_TABLET | Freq: Two times a day (BID) | ORAL | Status: DC
  Administered 2020-12-15 – 2020-12-18 (×7): 0.5 mg via ORAL
  Filled 2020-12-15 (×8): qty 1

## 2020-12-15 MED ORDER — NICOTINE 21 MG/24HR TD PT24
21.0000 mg | MEDICATED_PATCH | Freq: Every day | TRANSDERMAL | Status: DC
  Administered 2020-12-15 – 2020-12-18 (×4): 21 mg via TRANSDERMAL
  Filled 2020-12-15 (×4): qty 1

## 2020-12-15 MED ORDER — INSULIN ASPART 100 UNIT/ML ~~LOC~~ SOLN: 0.0000 [IU] | Freq: Three times a day (TID) | SUBCUTANEOUS | Status: DC

## 2020-12-15 MED ORDER — ALBUTEROL SULFATE HFA 108 (90 BASE) MCG/ACT IN AERS
2.0000 | INHALATION_SPRAY | RESPIRATORY_TRACT | Status: DC | PRN
  Filled 2020-12-15: qty 6.7

## 2020-12-15 MED ORDER — SODIUM CHLORIDE 0.9 % IV SOLN: INTRAVENOUS | Status: DC

## 2020-12-15 MED ORDER — PRAVASTATIN SODIUM 40 MG PO TABS
40.0000 mg | ORAL_TABLET | Freq: Every day | ORAL | Status: DC
  Administered 2020-12-15 – 2020-12-16 (×2): 40 mg via ORAL
  Filled 2020-12-15 (×2): qty 1

## 2020-12-15 MED ORDER — CARVEDILOL 12.5 MG PO TABS
12.5000 mg | ORAL_TABLET | Freq: Two times a day (BID) | ORAL | Status: DC
  Administered 2020-12-16 – 2020-12-18 (×6): 12.5 mg via ORAL
  Filled 2020-12-15 (×6): qty 1

## 2020-12-15 MED ORDER — ASPIRIN 81 MG PO CHEW
324.0000 mg | CHEWABLE_TABLET | Freq: Once | ORAL | Status: AC
  Administered 2020-12-15: 324 mg via ORAL
  Filled 2020-12-15: qty 4

## 2020-12-15 MED ORDER — OXYCODONE-ACETAMINOPHEN 5-325 MG PO TABS: 1.0000 | ORAL_TABLET | ORAL | Status: DC | PRN

## 2020-12-15 MED ORDER — ONDANSETRON HCL 4 MG/2ML IJ SOLN: 4.0000 mg | Freq: Three times a day (TID) | INTRAMUSCULAR | Status: DC | PRN

## 2020-12-15 MED ORDER — HEPARIN BOLUS VIA INFUSION
3700.0000 [IU] | Freq: Once | INTRAVENOUS | Status: AC
  Administered 2020-12-15: 3700 [IU] via INTRAVENOUS
  Filled 2020-12-15: qty 3700

## 2020-12-15 MED ORDER — NITROGLYCERIN 0.4 MG SL SUBL: 0.4000 mg | SUBLINGUAL_TABLET | SUBLINGUAL | Status: DC | PRN

## 2020-12-15 MED ORDER — MORPHINE SULFATE (PF) 2 MG/ML IV SOLN: 0.5000 mg | INTRAVENOUS | Status: DC | PRN

## 2020-12-15 MED ORDER — SODIUM CHLORIDE 0.9 % IV BOLUS
1000.0000 mL | Freq: Once | INTRAVENOUS | Status: AC
  Administered 2020-12-15: 1000 mL via INTRAVENOUS

## 2020-12-15 MED ORDER — DONEPEZIL HCL 5 MG PO TABS
10.0000 mg | ORAL_TABLET | Freq: Every day | ORAL | Status: DC
  Administered 2020-12-16 – 2020-12-18 (×3): 10 mg via ORAL
  Filled 2020-12-15 (×4): qty 2

## 2020-12-15 MED ORDER — AMLODIPINE BESYLATE 5 MG PO TABS
5.0000 mg | ORAL_TABLET | Freq: Every day | ORAL | Status: DC
  Administered 2020-12-15 – 2020-12-18 (×4): 5 mg via ORAL
  Filled 2020-12-15 (×4): qty 1

## 2020-12-15 MED ORDER — ACETAMINOPHEN 325 MG PO TABS: 650.0000 mg | ORAL_TABLET | Freq: Four times a day (QID) | ORAL | Status: DC | PRN

## 2020-12-15 MED ORDER — PALIPERIDONE ER 3 MG PO TB24
9.0000 mg | ORAL_TABLET | Freq: Every day | ORAL | Status: DC
  Administered 2020-12-15 – 2020-12-18 (×4): 9 mg via ORAL
  Filled 2020-12-15 (×4): qty 3

## 2020-12-15 MED ORDER — TRIHEXYPHENIDYL HCL 5 MG PO TABS
5.0000 mg | ORAL_TABLET | Freq: Two times a day (BID) | ORAL | Status: DC
  Administered 2020-12-16 – 2020-12-18 (×6): 5 mg via ORAL
  Filled 2020-12-15 (×7): qty 1

## 2020-12-15 MED ORDER — TRAZODONE HCL 50 MG PO TABS
50.0000 mg | ORAL_TABLET | Freq: Every day | ORAL | Status: DC
  Administered 2020-12-15 – 2020-12-17 (×3): 50 mg via ORAL
  Filled 2020-12-15 (×3): qty 1

## 2020-12-15 MED ORDER — HYDRALAZINE HCL 20 MG/ML IJ SOLN: 5.0000 mg | INTRAMUSCULAR | Status: DC | PRN

## 2020-12-15 MED ORDER — INSULIN ASPART 100 UNIT/ML ~~LOC~~ SOLN: 0.0000 [IU] | Freq: Every day | SUBCUTANEOUS | Status: DC

## 2020-12-15 MED ORDER — ASPIRIN EC 81 MG PO TBEC
81.0000 mg | DELAYED_RELEASE_TABLET | Freq: Every day | ORAL | Status: DC
  Administered 2020-12-16 – 2020-12-18 (×3): 81 mg via ORAL
  Filled 2020-12-15 (×3): qty 1

## 2020-12-15 MED ORDER — HEPARIN (PORCINE) 25000 UT/250ML-% IV SOLN
950.0000 [IU]/h | INTRAVENOUS | Status: AC
  Administered 2020-12-15 – 2020-12-16 (×2): 750 [IU]/h via INTRAVENOUS
  Filled 2020-12-15 (×2): qty 250

## 2020-12-15 MED ORDER — DM-GUAIFENESIN ER 30-600 MG PO TB12: 1.0000 | ORAL_TABLET | Freq: Two times a day (BID) | ORAL | Status: DC | PRN

## 2020-12-15 MED ORDER — CLOPIDOGREL BISULFATE 75 MG PO TABS
75.0000 mg | ORAL_TABLET | Freq: Every day | ORAL | Status: DC
  Administered 2020-12-16 – 2020-12-18 (×3): 75 mg via ORAL
  Filled 2020-12-15 (×3): qty 1
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
## 2020-12-15 NOTE — Progress Notes (Signed)
*  PRELIMINARY RESULTS* Echocardiogram 2D Echocardiogram has been performed.  Cristela Blue 12/15/2020, 1:06 PM

## 2020-12-15 NOTE — Consult Note (Signed)
Terrence Johnson is a 80 y.o. male  [id number]  Primary Cardiologist: Adrian Blackwater Reason for Consultation: Elevated troponin  HPI: This is a 80 year old male patient who presented to the hospital with weakness and elevated troponin and I was asked to evaluate the patient.  Patient apparently is unable to give any history and information was obtained from ER doctor information.   Review of Systems: Unable to get much history   Past Medical History:  Diagnosis Date  . Cognitive impairment   . COPD (chronic obstructive pulmonary disease) (HCC)   . Diabetes mellitus without complication (HCC)   . Hypercholesteremia   . Hypertension   . Psychotic disorder (HCC)     (Not in a hospital admission)    . amLODipine  5 mg Oral Daily  . aspirin  324 mg Oral Once  . heparin  3,700 Units Intravenous Once  . insulin aspart  0-5 Units Subcutaneous QHS  . insulin aspart  0-9 Units Subcutaneous TID WC  . nicotine  21 mg Transdermal Daily    Infusions: . sodium chloride    . heparin      No Known Allergies  Social History   Socioeconomic History  . Marital status: Single    Spouse name: Not on file  . Number of children: Not on file  . Years of education: Not on file  . Highest education level: Not on file  Occupational History  . Not on file  Tobacco Use  . Smoking status: Current Every Day Smoker  . Smokeless tobacco: Never Used  Substance and Sexual Activity  . Alcohol use: No  . Drug use: No  . Sexual activity: Not on file  Other Topics Concern  . Not on file  Social History Narrative  . Not on file   Social Determinants of Health   Financial Resource Strain: Not on file  Food Insecurity: Not on file  Transportation Needs: Not on file  Physical Activity: Not on file  Stress: Not on file  Social Connections: Not on file  Intimate Partner Violence: Not on file    Family History  Problem Relation Age of Onset  . Hypertension Mother     PHYSICAL  EXAM: Vitals:   12/15/20 0830 12/15/20 1130  BP: (!) 159/76 (!) 155/55  Pulse: (!) 54 (!) 50  Resp: 13 15  Temp:    SpO2: 100% 99%    No intake or output data in the 24 hours ending 12/15/20 1154  General:  Well appearing. No respiratory difficulty HEENT: normal Neck: supple. no JVD. Carotids 2+ bilat; no bruits. No lymphadenopathy or thryomegaly appreciated. Cor: PMI nondisplaced. Regular rate & rhythm. No rubs, gallops or murmurs. Lungs: clear Abdomen: soft, nontender, nondistended. No hepatosplenomegaly. No bruits or masses. Good bowel sounds. Extremities: no cyanosis, clubbing, rash, edema Neuro: alert & oriented x 3, cranial nerves grossly intact. moves all 4 extremities w/o difficulty. Affect pleasant.  ECG: Normal sinus rhythm nonspecific ST-T changes  Results for orders placed or performed during the hospital encounter of 12/15/20 (from the past 24 hour(s))  CBG monitoring, ED     Status: Abnormal   Collection Time: 12/15/20  7:41 AM  Result Value Ref Range   Glucose-Capillary 127 (H) 70 - 99 mg/dL  CBC with Differential     Status: Abnormal   Collection Time: 12/15/20  7:59 AM  Result Value Ref Range   WBC 5.4 4.0 - 10.5 K/uL   RBC 4.06 (L) 4.22 - 5.81 MIL/uL  Hemoglobin 12.7 (L) 13.0 - 17.0 g/dL   HCT 64.3 (L) 32.9 - 51.8 %   MCV 95.3 80.0 - 100.0 fL   MCH 31.3 26.0 - 34.0 pg   MCHC 32.8 30.0 - 36.0 g/dL   RDW 84.1 66.0 - 63.0 %   Platelets 165 150 - 400 K/uL   nRBC 0.0 0.0 - 0.2 %   Neutrophils Relative % 59 %   Neutro Abs 3.2 1.7 - 7.7 K/uL   Lymphocytes Relative 27 %   Lymphs Abs 1.5 0.7 - 4.0 K/uL   Monocytes Relative 12 %   Monocytes Absolute 0.6 0.1 - 1.0 K/uL   Eosinophils Relative 2 %   Eosinophils Absolute 0.1 0.0 - 0.5 K/uL   Basophils Relative 0 %   Basophils Absolute 0.0 0.0 - 0.1 K/uL   Immature Granulocytes 0 %   Abs Immature Granulocytes 0.02 0.00 - 0.07 K/uL  Comprehensive metabolic panel     Status: Abnormal   Collection Time: 12/15/20   7:59 AM  Result Value Ref Range   Sodium 138 135 - 145 mmol/L   Potassium 3.6 3.5 - 5.1 mmol/L   Chloride 99 98 - 111 mmol/L   CO2 29 22 - 32 mmol/L   Glucose, Bld 124 (H) 70 - 99 mg/dL   BUN 23 8 - 23 mg/dL   Creatinine, Ser 1.60 (H) 0.61 - 1.24 mg/dL   Calcium 9.8 8.9 - 10.9 mg/dL   Total Protein 7.6 6.5 - 8.1 g/dL   Albumin 3.9 3.5 - 5.0 g/dL   AST 22 15 - 41 U/L   ALT 10 0 - 44 U/L   Alkaline Phosphatase 54 38 - 126 U/L   Total Bilirubin 0.7 0.3 - 1.2 mg/dL   GFR, Estimated 55 (L) >60 mL/min   Anion gap 10 5 - 15  TSH     Status: None   Collection Time: 12/15/20  7:59 AM  Result Value Ref Range   TSH 3.627 0.350 - 4.500 uIU/mL  Troponin I (High Sensitivity)     Status: Abnormal   Collection Time: 12/15/20  7:59 AM  Result Value Ref Range   Troponin I (High Sensitivity) 1,245 (HH) <18 ng/L  Brain natriuretic peptide     Status: None   Collection Time: 12/15/20  7:59 AM  Result Value Ref Range   B Natriuretic Peptide 80.1 0.0 - 100.0 pg/mL  Ammonia     Status: Abnormal   Collection Time: 12/15/20  8:00 AM  Result Value Ref Range   Ammonia <9 (L) 9 - 35 umol/L  APTT     Status: None   Collection Time: 12/15/20 11:18 AM  Result Value Ref Range   aPTT 28 24 - 36 seconds  Protime-INR     Status: None   Collection Time: 12/15/20 11:18 AM  Result Value Ref Range   Prothrombin Time 14.2 11.4 - 15.2 seconds   INR 1.1 0.8 - 1.2   CT Head Wo Contrast  Result Date: 12/15/2020 CLINICAL DATA:  Altered mental status with difficulty ambulating EXAM: CT HEAD WITHOUT CONTRAST TECHNIQUE: Contiguous axial images were obtained from the base of the skull through the vertex without intravenous contrast. COMPARISON:  February 16, 2017 FINDINGS: Brain: Mild to moderate diffuse atrophy is stable. There is no intracranial mass, hemorrhage, extra-axial fluid collection, or midline shift. There is decreased attenuation throughout much of the centra semiovale bilaterally, stable. There is evidence of  a prior lacunar type infarct in the left thalamus. No  acute appearing infarct is appreciable. Vascular: No hyperdense vessel. There is calcification in each carotid siphon region as well as in the internal carotid arteries at the petrous levels. Skull: Bony calvarium appears intact. There is a small left parietal scalp hematoma. Sinuses/Orbits: There is mucosal thickening in the right medial sphenoid sinus as well as in multiple ethmoid air cells. Visualized orbits appear symmetric bilaterally. Other: Visualized mastoid air cells are clear. IMPRESSION: Atrophy with fairly extensive periventricular small vessel disease, stable. 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 No acute appearing infarct evident. Small left parietal scalp hematoma.  No evident fracture. Multiple foci of arterial vascular calcification noted. Paranasal sinus disease at several sites noted. Electronically Signed   By: Bretta Bang III M.D.   On: 12/15/2020 09:28   DG Chest Portable 1 View  Result Date: 12/15/2020 CLINICAL DATA:  Weakness EXAM: PORTABLE CHEST 1 VIEW COMPARISON:  10/15/2020 FINDINGS: Cardiomediastinal contours are within normal limits. Patchy density in the mid left lung. Mild elevation of right hemidiaphragm. No pleural effusion or pneumothorax. The visualized skeletal structures are unremarkable. IMPRESSION: Patchy mid left lung atelectasis/consolidation. Electronically Signed   By: Guadlupe Spanish M.D.   On: 12/15/2020 10:10   DG HIP UNILAT WITH PELVIS 2-3 VIEWS LEFT  Result Date: 12/15/2020 CLINICAL DATA:  Weakness.  Unable to stand.  Pain. EXAM: DG HIP (WITH OR WITHOUT PELVIS) 2-3V LEFT COMPARISON:  10/05/2020. FINDINGS: Degenerative changes lumbar spine and both hips. No acute bony or joint abnormality. No evidence of fracture dislocation. Peripheral vascular calcification. IMPRESSION: 1. Degenerative changes lumbar spine and both hips. No acute bony abnormality. 2.  Peripheral vascular disease.  Electronically Signed   By: Maisie Fus  Register   On: 12/15/2020 11:17     ASSESSMENT AND PLAN: Elevated troponin most likely due to demand ischemia although it is 1200 troponin.  Patient probably has coronary artery disease but because of alcoholic dementia would have to watch the patient conservatively and consider the patient making DNR.  Will get echocardiogram to further evaluate ejection fraction and wall motion.  Tayon Parekh A

## 2020-12-15 NOTE — ED Triage Notes (Signed)
Pt from group home - see note, for weakness that was noticed on waking up, approx. 0700. Pt was normal at bedtime, 2000 4/25. Per staff pt was unsteady on feet and would have fallen if not assisted.

## 2020-12-15 NOTE — Consult Note (Signed)
ANTICOAGULATION CONSULT NOTE   Pharmacy Consult for Heparin Indication: chest pain/ACS  Patient Measurements: Heparin Dosing Weight: 63.5 kg  Labs: Recent Labs    12/15/20 0759 12/15/20 1118 12/15/20 1248 12/15/20 1705 12/15/20 1758  HGB 12.7*  --   --   --   --   HCT 38.7*  --   --   --   --   PLT 165  --   --   --   --   APTT  --  28  --   --   --   LABPROT  --  14.2  --   --   --   INR  --  1.1  --   --   --   HEPARINUNFRC  --   --   --   --  0.78*  CREATININE 1.32*  --   --   --   --   TROPONINIHS 1,245* 1,084* 1,141* 900*  --     Estimated Creatinine Clearance: 36.5 mL/min (A) (by C-G formula based on SCr of 1.32 mg/dL (H)).   Medical History: Past Medical History:  Diagnosis Date  . Cognitive impairment   . COPD (chronic obstructive pulmonary disease) (HCC)   . Diabetes mellitus without complication (HCC)   . Hypercholesteremia   . Hypertension   . Psychotic disorder (HCC)    Infusions:  . sodium chloride 75 mL/hr at 12/15/20 1300  . heparin 750 Units/hr (12/15/20 1242)    Assessment: Pharmacy consulted to start heparin for ACS. Trop 1245. No note of DOAC PTA.   4/26 at 1758 HL 0.78; 750 units/hr  Goal of Therapy:  Heparin level 0.3-0.7 units/ml Monitor platelets by anticoagulation protocol: Yes   Plan:  --Heparin level is supratherapeutic. Will decrease IV heparin rate to 700 units/hr --Re-check HL 8 hours after rate change --Daily CBC per protocol while on IV heparin  Tressie Ellis 12/15/2020,6:42 PM

## 2020-12-15 NOTE — Consult Note (Signed)
ANTICOAGULATION CONSULT NOTE   Pharmacy Consult for Heparin Indication: chest pain/ACS  No Known Allergies  Patient Measurements: Weight: 63.5 kg (140 lb) Heparin Dosing Weight: 63.5 kg  Vital Signs: Temp: 98 F (36.7 C) (04/26 0753) Temp Source: Oral (04/26 0753) BP: 177/86 (04/26 0753) Pulse Rate: 57 (04/26 0753)  Labs: Recent Labs    12/15/20 0759  HGB 12.7*  HCT 38.7*  PLT 165  CREATININE 1.32*  TROPONINIHS 1,245*    Estimated Creatinine Clearance: 36.5 mL/min (A) (by C-G formula based on SCr of 1.32 mg/dL (H)).   Medical History: Past Medical History:  Diagnosis Date  . Cognitive impairment   . COPD (chronic obstructive pulmonary disease) (HCC)   . Diabetes mellitus without complication (HCC)   . Hypercholesteremia   . Hypertension   . Psychotic disorder (HCC)     Medications:  (Not in a hospital admission)  Scheduled:  . aspirin  324 mg Oral Once   Infusions:   PRN:  Anti-infectives (From admission, onward)   None      Assessment: Pharmacy consulted to start heparin for ACS. Trop 1245. No note of DOAC PTA.   Goal of Therapy:  Heparin level 0.3-0.7 units/ml Monitor platelets by anticoagulation protocol: Yes   Plan:  Give 3700 units bolus x 1 Start heparin infusion at 750 units/hr Check anti-Xa level in 8 hours and daily while on heparin Continue to monitor H&H and platelets  Ronnald Ramp 12/15/2020,9:48 AM

## 2020-12-15 NOTE — ED Notes (Signed)
Pt from [facility], [place], [place], [phone].

## 2020-12-16 DIAGNOSIS — N182 Chronic kidney disease, stage 2 (mild): Secondary | ICD-10-CM

## 2020-12-16 DIAGNOSIS — R531 Weakness: Secondary | ICD-10-CM

## 2020-12-16 DIAGNOSIS — N189 Chronic kidney disease, unspecified: Secondary | ICD-10-CM

## 2020-12-16 DIAGNOSIS — R4189 Other symptoms and signs involving cognitive functions and awareness: Secondary | ICD-10-CM

## 2020-12-16 DIAGNOSIS — E1122 Type 2 diabetes mellitus with diabetic chronic kidney disease: Secondary | ICD-10-CM

## 2020-12-16 DIAGNOSIS — N179 Acute kidney failure, unspecified: Secondary | ICD-10-CM

## 2020-12-16 DIAGNOSIS — I214 Non-ST elevation (NSTEMI) myocardial infarction: Principal | ICD-10-CM

## 2020-12-16 LAB — GLUCOSE, CAPILLARY
Glucose-Capillary: 95 mg/dL (ref 70–99)
Glucose-Capillary: 103 mg/dL — ABNORMAL HIGH (ref 70–99)
Glucose-Capillary: 128 mg/dL — ABNORMAL HIGH (ref 70–99)
Glucose-Capillary: 75 mg/dL (ref 70–99)

## 2020-12-16 LAB — CBC
HCT: 36.1 % — ABNORMAL LOW (ref 39.0–52.0)
Hemoglobin: 12.2 g/dL — ABNORMAL LOW (ref 13.0–17.0)
MCH: 31.9 pg (ref 26.0–34.0)
MCHC: 33.8 g/dL (ref 30.0–36.0)
MCV: 94.3 fL (ref 80.0–100.0)
Platelets: 158 10*3/uL (ref 150–400)
RBC: 3.83 MIL/uL — ABNORMAL LOW (ref 4.22–5.81)
RDW: 14.4 % (ref 11.5–15.5)
WBC: 4.4 10*3/uL (ref 4.0–10.5)
nRBC: 1.6 % — ABNORMAL HIGH (ref 0.0–0.2)

## 2020-12-16 LAB — LIPID PANEL
Cholesterol: 119 mg/dL (ref 0–200)
HDL: 40 mg/dL — ABNORMAL LOW (ref >40)
LDL Cholesterol: 71 mg/dL (ref 0–99)
Total CHOL/HDL Ratio: 3 RATIO
Triglycerides: 42 mg/dL (ref <150)
VLDL: 8 mg/dL (ref 0–40)

## 2020-12-16 LAB — BASIC METABOLIC PANEL
Anion gap: 10 (ref 5–15)
BUN: 22 mg/dL (ref 8–23)
CO2: 28 mmol/L (ref 22–32)
Calcium: 9.2 mg/dL (ref 8.9–10.3)
Chloride: 101 mmol/L (ref 98–111)
Creatinine, Ser: 1.09 mg/dL (ref 0.61–1.24)
GFR, Estimated: >60 mL/min (ref >60)
Glucose, Bld: 99 mg/dL (ref 70–99)
Potassium: 3.5 mmol/L (ref 3.5–5.1)
Sodium: 139 mmol/L (ref 135–145)

## 2020-12-16 LAB — HEMOGLOBIN A1C
Hgb A1c MFr Bld: 6.2 % — ABNORMAL HIGH (ref 4.8–5.6)
Mean Plasma Glucose: 131.24 mg/dL

## 2020-12-16 LAB — HEPARIN LEVEL (UNFRACTIONATED)
Heparin Unfractionated: 0.51 IU/mL (ref 0.30–0.70)
Heparin Unfractionated: 0.26 IU/mL — ABNORMAL LOW (ref 0.30–0.70)

## 2020-12-16 MED ORDER — INSULIN ASPART 100 UNIT/ML ~~LOC~~ SOLN: 0.0000 [IU] | Freq: Every day | SUBCUTANEOUS | Status: DC

## 2020-12-16 MED ORDER — INSULIN ASPART 100 UNIT/ML ~~LOC~~ SOLN
0.0000 [IU] | Freq: Three times a day (TID) | SUBCUTANEOUS | Status: DC
  Administered 2020-12-16: 1 [IU] via SUBCUTANEOUS
  Filled 2020-12-16: qty 1

## 2020-12-16 MED ORDER — HEPARIN BOLUS VIA INFUSION
950.0000 [IU] | Freq: Once | INTRAVENOUS | Status: AC
  Administered 2020-12-16: 950 [IU] via INTRAVENOUS
  Filled 2020-12-16: qty 950

## 2020-12-16 NOTE — TOC Initial Note (Signed)
Transition of Care [facility]) - Initial/Assessment Note    Patient Details  Name: Terrence Johnson MRN: [id number] Date of Birth: 1941/07/21  [facility]) CM/SW Contact:    Gildardo Griffes, LCSW Phone Number: 12/16/2020, 12:56 PM  Clinical Narrative:                  Clinician spoke with patient's brother Archie to inform of PT recommendation of SNF at time of discharge due to limited mobility independence. Patient's brother Briscoe Burns reports if his brother needs SNF then he is in agreement with this. Patient's brother reports no preference of SNF, agreeable for clinician to fax out referrals. Pending bed offers at this time.   Expected Discharge Plan: Skilled Nursing Facility Barriers to Discharge: Continued Medical Work up   Patient Goals and CMS Choice   CMS Medicare.gov Compare Post Acute Care list provided to:: Patient Represenative (must comment) (patient's brother Archie) Choice offered to / list presented to : Sibling  Expected Discharge Plan and Services Expected Discharge Plan: Skilled Nursing Facility     Post Acute Care Choice: Skilled Nursing Facility Living arrangements for the past 2 months: Group Home                                      Prior Living Arrangements/Services Living arrangements for the past 2 months: Group Home Lives with:: Facility Resident   Do you feel safe going back to the place where you live?: No   Needs SNF level of care  Need for Family Participation in Patient Care: Yes (Comment) Care giver support system in place?: Yes (comment)      Activities of Daily Living Home Assistive Devices/Equipment: Eyeglasses,Walker (specify type) ADL Screening (condition at time of admission) Patient's cognitive ability adequate to safely complete daily activities?: Yes Is the patient deaf or have difficulty hearing?: No Does the patient have difficulty seeing, even when wearing glasses/contacts?: No Does the patient have difficulty  concentrating, remembering, or making decisions?: Yes Patient able to express need for assistance with ADLs?: Yes Does the patient have difficulty dressing or bathing?: No Independently performs ADLs?: Yes (appropriate for developmental age) Does the patient have difficulty walking or climbing stairs?: Yes Weakness of Legs: Both Weakness of Arms/Hands: Both  Permission Sought/Granted Permission sought to share information with : Case Manager,Facility Contact Representative,Family Supports Permission granted to share information with : Yes, Verbal Permission Granted  Share Information with NAME: Archie  Permission granted to share info w AGENCY: SNFs  Permission granted to share info w Relationship: brother  Permission granted to share info w Contact Information: [phone]  Emotional Assessment       Orientation: : Oriented to Self   Psych Involvement: No (comment)  Admission diagnosis:  Fall [W19.XXXA] NSTEMI (non-ST elevated myocardial infarction) (HCC) [I21.4] Generalized weakness [R53.1] Patient Active Problem List   Diagnosis Date Noted  . NSTEMI (non-ST elevated myocardial infarction) (HCC) 12/15/2020  . Cognitive impairment   . Type II diabetes mellitus with renal manifestations (HCC)   . CKD (chronic kidney disease), stage IIIa   . HLD (hyperlipidemia)   . Hypertension   . COPD (chronic obstructive pulmonary disease) (HCC)   . Generalized weakness   . Onychomycosis 04/13/2020  . 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 SOB (shortness of breath) 03/07/2017  . Schizophrenia (HCC) 02/18/2017  . 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 Acute cystitis 02/16/2017   PCP:  Patient, No Pcp Per (Inactive) Pharmacy:   [facility] - [place], [place] - [address] [facility] [address] [place] [address] Phone: [phone] Fax: [phone]     Social Determinants of Health (SDOH) Interventions    Readmission Risk Interventions No  flowsheet data found.

## 2020-12-16 NOTE — Consult Note (Signed)
ANTICOAGULATION CONSULT NOTE   Pharmacy Consult for Heparin Indication: chest pain/ACS  Patient Measurements: Heparin Dosing Weight: 63.5 kg  Labs: Recent Labs    12/15/20 0759 12/15/20 1118 12/15/20 1248 12/15/20 1705 12/15/20 1758 12/16/20 0622  HGB 12.7*  --   --   --   --  12.2*  HCT 38.7*  --   --   --   --  36.1*  PLT 165  --   --   --   --  158  APTT  --  28  --   --   --   --   LABPROT  --  14.2  --   --   --   --   INR  --  1.1  --   --   --   --   HEPARINUNFRC  --   --   --   --  0.78* 0.51  CREATININE 1.32*  --   --   --   --   --   TROPONINIHS 1,245* 1,084* 1,141* 900*  --   --     Estimated Creatinine Clearance: 36.5 mL/min (A) (by C-G formula based on SCr of 1.32 mg/dL (H)).   Medical History: Past Medical History:  Diagnosis Date  . Cognitive impairment   . COPD (chronic obstructive pulmonary disease) (HCC)   . Diabetes mellitus without complication (HCC)   . Hypercholesteremia   . Hypertension   . Psychotic disorder (HCC)    Infusions:  . sodium chloride 75 mL/hr at 12/15/20 1300  . heparin 700 Units/hr (12/15/20 1904)    Assessment: Pharmacy consulted to start heparin for ACS. Trop 1245. No note of DOAC PTA.   4/26 at 1758 HL 0.78; 750 units/hr  Goal of Therapy:  Heparin level 0.3-0.7 units/ml Monitor platelets by anticoagulation protocol: Yes   Plan:  4/27:  HL @ 0622 = 0.51 Will continue pt on current rate and recheck HL in 8 hrs on 4/27 @ 1400.   Elita Dame D 12/16/2020,7:02 AM

## 2020-12-16 NOTE — TOC Progression Note (Signed)
Transition of Care [facility]) - Progression Note    Patient Details  Name: Terrence Johnson MRN: [id number] Date of Birth: 1941/01/16  [facility]) CM/SW Contact  Gildardo Griffes, [place] Phone Number: 12/16/2020, 3:29 PM  Clinical Narrative:     Called patient's brother Briscoe Burns to provide bed offers which include [facility] in [place] and [facility] in [place]. No answer, and unable to leave voice message.   Expected Discharge Plan: Skilled Nursing Facility Barriers to Discharge: Continued Medical Work up  Expected Discharge Plan and Services Expected Discharge Plan: Skilled Nursing Facility     Post Acute Care Choice: Skilled Nursing Facility Living arrangements for the past 2 months: Group Home                                       Social Determinants of Health (SDOH) Interventions    Readmission Risk Interventions No flowsheet data found.

## 2020-12-16 NOTE — Progress Notes (Signed)
Patient ID: Terrence Johnson, male   DOB: 08/14/1941, 80 y.o.   MRN: [id number] [facility] PROGRESS NOTE  Terrence Johnson RXV:[id number] DOB: [birth year] DOA: 12/15/2020 PCP: Patient, No Pcp Per (Inactive)  HPI/Subjective: The patient is a poor historian just complains of weakness in his legs and getting around.  No complaints of chest pain or shortness of breath.  Objective: Vitals:   12/16/20 0847 12/16/20 1158  BP:  (!) 169/75  Pulse: 62 60  Resp:  17  Temp:  98.1 F (36.7 C)  SpO2:  100%    Intake/Output Summary (Last 24 hours) at 12/16/2020 1352 Last data filed at 12/16/2020 1159 Gross per 24 hour  Intake 1199.13 ml  Output 850 ml  Net 349.13 ml   Filed Weights   12/15/20 0755  Weight: 63.5 kg    ROS: Review of Systems  Constitutional: Positive for malaise/fatigue.  Respiratory: Negative for cough and shortness of breath.   Cardiovascular: Negative for chest pain.  Gastrointestinal: Negative for abdominal pain, nausea and vomiting.   Exam: Physical Exam HENT:     Head: Normocephalic.     Mouth/Throat:     Pharynx: No oropharyngeal exudate.  Eyes:     General: Lids are normal.     Conjunctiva/sclera: Conjunctivae normal.  Cardiovascular:     Rate and Rhythm: Normal rate and regular rhythm.     Heart sounds: Normal heart sounds, S1 normal and S2 normal.  Pulmonary:     Breath sounds: Normal breath sounds. No decreased breath sounds, wheezing, rhonchi or rales.  Abdominal:     Palpations: Abdomen is soft.     Tenderness: There is no abdominal tenderness.  Musculoskeletal:     Right ankle: No swelling.     Left ankle: No swelling.  Skin:    General: Skin is warm.     Findings: No rash.  Neurological:     Mental Status: He is alert.     Comments: Able to straight leg raise.  Answers yes or no questions does not elaborate much.       Data Reviewed: Basic Metabolic Panel: Recent Labs  Lab 12/15/20 0759 12/16/20 0622  NA 138 139  K 3.6 3.5  CL 99  101  CO2 29 28  GLUCOSE 124* 99  BUN 23 22  CREATININE 1.32* 1.09  CALCIUM 9.8 9.2   Liver Function Tests: Recent Labs  Lab 12/15/20 0759  AST 22  ALT 10  ALKPHOS 54  BILITOT 0.7  PROT 7.6  ALBUMIN 3.9    Recent Labs  Lab 12/15/20 0800  AMMONIA <9*   CBC: Recent Labs  Lab 12/15/20 0759 12/16/20 0622  WBC 5.4 4.4  NEUTROABS 3.2  --   HGB 12.7* 12.2*  HCT 38.7* 36.1*  MCV 95.3 94.3  PLT 165 158   BNP (last 3 results) Recent Labs    12/15/20 0759  BNP 80.1    CBG: Recent Labs  Lab 12/15/20 1258 12/15/20 1801 12/15/20 2005 12/16/20 0734 12/16/20 1154  GLUCAP 103* 127* 151* 95 103*    Recent Results (from the past 240 hour(s))  Resp Panel by RT-PCR (Flu A&B, Covid) Nasopharyngeal Swab     Status: None   Collection Time: 12/15/20  9:35 AM   Specimen: Nasopharyngeal Swab; Nasopharyngeal(NP) swabs in vial transport medium  Result Value Ref Range Status   SARS Coronavirus 2 by RT PCR NEGATIVE NEGATIVE Final    Comment: (NOTE) SARS-CoV-2 target nucleic acids are NOT DETECTED.  The SARS-CoV-2 RNA is  generally detectable in upper respiratory specimens during the acute phase of infection. The lowest concentration of SARS-CoV-2 viral copies this assay can detect is 138 copies/mL. A negative result does not preclude SARS-Cov-2 infection and should not be used as the sole basis for treatment or other patient management decisions. A negative result may occur with  improper specimen collection/handling, submission of specimen other than nasopharyngeal swab, presence of viral mutation(s) within the areas targeted by this assay, and inadequate number of viral copies(<138 copies/mL). A negative result must be combined with clinical observations, patient history, and epidemiological information. The expected result is Negative.  Fact Sheet for Patients:  BloggerCourse.comhttps://www.fda.gov/media/152166/download  Fact Sheet for Healthcare Providers:   SeriousBroker.ithttps://www.fda.gov/media/152162/download  This test is no t yet approved or cleared by the [place] FDA and  has been authorized for detection and/or diagnosis of SARS-CoV-2 by FDA under an Emergency Use Authorization (EUA). This EUA will remain  in effect (meaning this test can be used) for the duration of the COVID-19 declaration under Section 564(b)(1) of the Act, 21 U.S.C.section 360bbb-3(b)(1), unless the authorization is terminated  or revoked sooner.       Influenza A by PCR NEGATIVE NEGATIVE Final   Influenza B by PCR NEGATIVE NEGATIVE Final    Comment: (NOTE) The Xpert Xpress SARS-CoV-2/FLU/RSV plus assay is intended as an aid in the diagnosis of influenza from Nasopharyngeal swab specimens and should not be used as a sole basis for treatment. Nasal washings and aspirates are unacceptable for Xpert Xpress SARS-CoV-2/FLU/RSV testing.  Fact Sheet for Patients: BloggerCourse.comhttps://www.fda.gov/media/152166/download  Fact Sheet for Healthcare Providers: SeriousBroker.ithttps://www.fda.gov/media/152162/download  This test is not yet approved or cleared by the [place] FDA and has been authorized for detection and/or diagnosis of SARS-CoV-2 by FDA under an Emergency Use Authorization (EUA). This EUA will remain in effect (meaning this test can be used) for the duration of the COVID-19 declaration under Section 564(b)(1) of the Act, 21 U.S.C. section 360bbb-3(b)(1), unless the authorization is terminated or revoked.  Performed at [facility], [address] [place], [place] [address]   MRSA PCR Screening     Status: None   Collection Time: 12/15/20  6:44 PM   Specimen: Nasal Mucosa; Nasopharyngeal  Result Value Ref Range Status   MRSA by PCR NEGATIVE NEGATIVE Final    Comment:        The GeneXpert MRSA Assay (FDA approved for NASAL specimens only), is one component of a comprehensive MRSA colonization surveillance program. It is not intended to diagnose MRSA infection  nor to guide or monitor treatment for MRSA infections. Performed at [facility], [address] [place], [place] [address]      Studies: CT Head Wo Contrast  Result Date: 12/15/2020 CLINICAL DATA:  Altered mental status with difficulty ambulating EXAM: CT HEAD WITHOUT CONTRAST TECHNIQUE: Contiguous axial images were obtained from the base of the skull through the vertex without intravenous contrast. COMPARISON:  February 16, 2017 FINDINGS: Brain: Mild to moderate diffuse atrophy is stable. There is no intracranial mass, hemorrhage, extra-axial fluid collection, or midline shift. There is decreased attenuation throughout much of the centra semiovale bilaterally, stable. 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 No acute appearing infarct is appreciable. Vascular: No hyperdense vessel. There is calcification in each carotid siphon region as well as in the internal carotid arteries at the petrous levels. Skull: Bony calvarium appears intact. There is a small left parietal scalp hematoma. Sinuses/Orbits: There is mucosal thickening  in the right medial sphenoid sinus as well as in multiple ethmoid air cells. Visualized orbits appear symmetric bilaterally. Other: Visualized mastoid air cells are clear. IMPRESSION: Atrophy with fairly extensive periventricular small vessel disease, stable. 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 No acute appearing infarct evident. Small left parietal scalp hematoma.  No evident fracture. Multiple foci of arterial vascular calcification noted. Paranasal sinus disease at several sites noted. Electronically Signed   By: Bretta Bang III M.D.   On: 12/15/2020 09:28   DG Chest Portable 1 View  Result Date: 12/15/2020 CLINICAL DATA:  Weakness EXAM: PORTABLE CHEST 1 VIEW COMPARISON:  10/15/2020 FINDINGS: Cardiomediastinal contours are within normal limits. Patchy density in the mid left lung. Mild elevation of right hemidiaphragm.  No pleural effusion or pneumothorax. The visualized skeletal structures are unremarkable. IMPRESSION: Patchy mid left lung atelectasis/consolidation. Electronically Signed   By: Guadlupe Spanish M.D.   On: 12/15/2020 10:10   ECHOCARDIOGRAM COMPLETE  Result Date: 12/15/2020    ECHOCARDIOGRAM REPORT   Patient Name:   Kayvan Allcorn Date of Exam: 12/15/2020 Medical Rec #:  [id number]      Height:       61.0 in Accession #:    [id number]     Weight:       140.0 lb Date of Birth:  [birth year]       BSA:          1.623 m Patient Age:    80 years       BP:           155/55 mmHg Patient Gender: M              HR:           50 bpm. Exam Location:  [facility] Procedure: 2D Echo, Cardiac Doppler and Color Doppler Indications:     Acute ischemic heart disease -unspecified I24.9  History:         Patient has no prior history of Echocardiogram examinations.                  COPD; Risk Factors:Hypertension and Diabetes.  Sonographer:     Cristela Blue RDCS (AE) Referring Phys:  Izola Price [facility] A KHAN Diagnosing Phys: Adrian Blackwater MD  Sonographer Comments: Suboptimal parasternal window. IMPRESSIONS  1. Left ventricular ejection fraction, by estimation, is 50 to 55%. The left ventricle has low normal function. The left ventricle has no regional wall motion abnormalities. There is mild concentric left ventricular hypertrophy. Left ventricular diastolic parameters are consistent with Grade I diastolic dysfunction (impaired relaxation).  2. Right ventricular systolic function is normal. The right ventricular size is normal.  3. The mitral valve is normal in structure. No evidence of mitral valve regurgitation. No evidence of mitral stenosis.  4. The aortic valve is normal in structure. Aortic valve regurgitation is not visualized. Mild aortic valve sclerosis is present, with no evidence of aortic valve stenosis.  5. The inferior vena cava is normal in size with greater than 50% respiratory variability, suggesting right atrial pressure of 3 mmHg.  FINDINGS  Left Ventricle: Left ventricular ejection fraction, by estimation, is 50 to 55%. The left ventricle has low normal function. The left ventricle has no regional wall motion abnormalities. The left ventricular internal cavity size was normal in size. There is mild concentric left ventricular hypertrophy. Left ventricular diastolic parameters are consistent with Grade I diastolic dysfunction (impaired relaxation). Right Ventricle: The right ventricular size is normal. No increase in right  ventricular wall thickness. Right ventricular systolic function is normal. Left Atrium: Left atrial size was normal in size. Right Atrium: Right atrial size was normal in size. Pericardium: There is no evidence of pericardial effusion. Mitral Valve: The mitral valve is normal in structure. No evidence of mitral valve regurgitation. No evidence of mitral valve stenosis. Tricuspid Valve: The tricuspid valve is normal in structure. Tricuspid valve regurgitation is not demonstrated. No evidence of tricuspid stenosis. Aortic Valve: The aortic valve is normal in structure. Aortic valve regurgitation is not visualized. Mild aortic valve sclerosis is present, with no evidence of aortic valve stenosis. Aortic valve mean gradient measures 1.0 mmHg. Aortic valve peak gradient measures 2.9 mmHg. Aortic valve area, by VTI measures 2.04 cm. Pulmonic Valve: The pulmonic valve was normal in structure. Pulmonic valve regurgitation is not visualized. No evidence of pulmonic stenosis. Aorta: The aortic root is normal in size and structure. Venous: The inferior vena cava is normal in size with greater than 50% respiratory variability, suggesting right atrial pressure of 3 mmHg. IAS/Shunts: No atrial level shunt detected by color flow Doppler.  LEFT VENTRICLE PLAX 2D LVIDd:         3.88 cm  Diastology LVIDs:         2.85 cm  LV e' medial:    6.42 cm/s LV PW:         1.12 cm  LV E/e' medial:  12.1 LV IVS:        1.75 cm  LV e' lateral:   3.48  cm/s LVOT diam:     2.10 cm  LV E/e' lateral: 22.3 LV SV:         37 LV SV Index:   23 LVOT Area:     3.46 cm  LEFT ATRIUM             Index       RIGHT ATRIUM           Index LA diam:        3.50 cm 2.16 cm/m  RA Area:     16.10 cm LA Vol (A2C):   37.6 ml 23.17 ml/m RA Volume:   45.90 ml  28.28 ml/m LA Vol (A4C):   30.1 ml 18.54 ml/m LA Biplane Vol: 35.0 ml 21.56 ml/m  AORTIC VALVE AV Area (Vmax):    1.74 cm AV Area (Vmean):   1.90 cm AV Area (VTI):     2.04 cm AV Vmax:           84.60 cm/s AV Vmean:          52.200 cm/s AV VTI:            0.183 m AV Peak Grad:      2.9 mmHg AV Mean Grad:      1.0 mmHg LVOT Vmax:         42.40 cm/s LVOT Vmean:        28.700 cm/s LVOT VTI:          0.108 m LVOT/AV VTI ratio: 0.59  AORTA Ao Root diam: 3.00 cm MITRAL VALVE MV Area (PHT): 3.77 cm    SHUNTS MV Decel Time: 201 msec    Systemic VTI:  0.11 m MV E velocity: 77.60 cm/s  Systemic Diam: 2.10 cm MV A velocity: 86.10 cm/s MV E/A ratio:  0.90 Adrian Blackwater MD Electronically signed by Adrian Blackwater MD Signature Date/Time: 12/15/2020/1:19:25 PM    Final    DG HIP UNILAT WITH PELVIS 2-3 VIEWS  LEFT  Result Date: 12/15/2020 CLINICAL DATA:  Weakness.  Unable to stand.  Pain. EXAM: DG HIP (WITH OR WITHOUT PELVIS) 2-3V LEFT COMPARISON:  10/05/2020. FINDINGS: Degenerative changes lumbar spine and both hips. No acute bony or joint abnormality. No evidence of fracture dislocation. Peripheral vascular calcification. IMPRESSION: 1. Degenerative changes lumbar spine and both hips. No acute bony abnormality. 2.  Peripheral vascular disease. Electronically Signed   By: Maisie Fus  Register   On: 12/15/2020 11:17    Scheduled Meds: . amLODipine  5 mg Oral Daily  . aspirin EC  81 mg Oral Daily  . benztropine  0.5 mg Oral BID  . carvedilol  12.5 mg Oral BID WC  . clopidogrel  75 mg Oral Daily  . donepezil  10 mg Oral Daily  . insulin aspart  0-5 Units Subcutaneous QHS  . insulin aspart  0-9 Units Subcutaneous TID WC  . nicotine   21 mg Transdermal Daily  . paliperidone  9 mg Oral QHS  . pravastatin  40 mg Oral QHS  . traZODone  50 mg Oral QHS  . trihexyphenidyl  5 mg Oral BID WC   Continuous Infusions: . heparin 700 Units/hr (12/15/20 1904)    Assessment/Plan:  1. NSTEMI.  Case discussed with cardiology and they recommend medical management.  Continue heparin drip again today.  Patient on aspirin, Plavix, Coreg and pravastatin. 2. Weakness.  Physical therapy recommending rehab.  We will check a CPK in the a.m. 3. Type 2 diabetes mellitus with hyperlipidemia.  On pravastatin.  Hemoglobin A1c low at 6.2.  May be able to cut back on some of the diabetic medications.  Patient takes metformin and Farxiga at home. 4. Acute kidney injury on chronic kidney disease stage II.  Creatinine up at 1.32 on presentation.  A few days prior was 0.95.  Improved to 1.09 with fluids. 5. Cognitive impairment on Aricept 6. Psychotic disorder history on numerous psychiatric medications      Code Status:     Code Status Orders  (From admission, onward)         Start     Ordered   12/15/20 1745  Full code  Continuous        12/15/20 1744        Code Status History    Date Active Date Inactive Code Status Order ID Comments User Context   02/16/2017 2058 02/20/2017 1514 Full Code [id number]  Alford Highland, MD ED   Advance Care Planning Activity     Family Communication: Unable to reach primary contact Disposition Plan: Status is: Inpatient  Dispo: The patient is from: Home              Anticipated d/c is to: Rehab              Patient currently being treated medically for NSTEMI.   Difficult to place patient.  Hopefully not.  Consultants:  Cardiology  Time spent: 28 minutes  Marky Buresh [employer]

## 2020-12-16 NOTE — [place] FL2 (Addendum)
[place] MEDICAID FL2 LEVEL OF CARE SCREENING TOOL     IDENTIFICATION  Patient Name: Terrence Johnson Birthdate: [birth year] Sex: male Admission Date (Current Location): 12/15/2020  [place] and [place] Number:  Chiropodist and Address:  [facility], [address], [place], [place] [address]      Provider Number: [id number]  Attending Physician Name and Address:  Alford Highland, MD  Relative Name and Phone Number:  Dacian Orrico (brother) [phone]    Current Level of Care: Hospital Recommended Level of Care: Skilled Nursing Facility Prior Approval Number: [id number] E Expires 01/16/21  Date Approved/Denied:   PASRR Number: [id number]  Discharge Plan: SNF    Current Diagnoses: Patient Active Problem List   Diagnosis Date Noted  . NSTEMI (non-ST elevated myocardial infarction) (HCC) 12/15/2020  . Cognitive impairment   . Type II diabetes mellitus with renal manifestations (HCC)   . CKD (chronic kidney disease), stage IIIa   . HLD (hyperlipidemia)   . Hypertension   . COPD (chronic obstructive pulmonary disease) (HCC)   . Generalized weakness   . Onychomycosis 04/13/2020  . 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 SOB (shortness of breath) 03/07/2017  . Schizophrenia (HCC) 02/18/2017  . 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 Acute cystitis 02/16/2017    Orientation RESPIRATION BLADDER Height & Weight     Self (dementia)  Normal Incontinent,External catheter Weight: 140 lb (63.5 kg) Height:     BEHAVIORAL SYMPTOMS/MOOD NEUROLOGICAL BOWEL NUTRITION STATUS     (Dementia) Incontinent Diet  AMBULATORY STATUS COMMUNICATION OF NEEDS Skin   Extensive Assist Verbally Other (Comment) (ecchymosis right arm)                       Personal Care Assistance Level of Assistance  Bathing,Feeding,Dressing Bathing Assistance: Limited assistance Feeding assistance: Limited assistance Dressing Assistance: Limited assistance      Functional Limitations Info  Sight,Hearing,Speech Sight Info: Impaired Hearing Info: Adequate Speech Info: Adequate    SPECIAL CARE FACTORS FREQUENCY  PT (By licensed PT),OT (By licensed OT)     PT Frequency: min 4x weekly OT Frequency: min 4x weekly            Contractures Contractures Info: Not present    Additional Factors Info  Code Status,Allergies Code Status Info: full Allergies Info: No Known Allergies           Current Medications (12/16/2020):  This is the current hospital active medication list Current Facility-Administered Medications  Medication Dose Route Frequency Provider Last Rate Last Admin  . acetaminophen (TYLENOL) tablet 650 mg  650 mg Oral Q6H PRN Lorretta Harp, MD      . albuterol (VENTOLIN HFA) 108 (90 Base) MCG/ACT inhaler 2 puff  2 puff Inhalation Q4H PRN Lorretta Harp, MD      . amLODipine (NORVASC) tablet 5 mg  5 mg Oral Daily Lorretta Harp, MD   5 mg at 12/16/20 0847  . aspirin EC tablet 81 mg  81 mg Oral Daily Lorretta Harp, MD   81 mg at 12/16/20 0847  . benztropine (COGENTIN) tablet 0.5 mg  0.5 mg Oral BID Lorretta Harp, MD   0.5 mg at 12/16/20 0847  . carvedilol (COREG) tablet 12.5 mg  12.5 mg Oral BID WC Lorretta Harp, MD   12.5 mg at 12/16/20 0847  . clopidogrel (PLAVIX) tablet 75 mg  75 mg Oral Daily Lorretta Harp, MD   75 mg at 12/16/20 0847  . dextromethorphan-guaiFENesin (MUCINEX DM) 30-600 MG per  12 hr tablet 1 tablet  1 tablet Oral BID PRN Lorretta Harp, MD      . donepezil (ARICEPT) tablet 10 mg  10 mg Oral Daily Lorretta Harp, MD   10 mg at 12/16/20 0847  . heparin ADULT infusion 100 units/mL (25000 units/259mL)  700 Units/hr Intravenous Continuous Tressie Ellis, RPH 7 mL/hr at 12/15/20 1904 700 Units/hr at 12/15/20 1904  . hydrALAZINE (APRESOLINE) injection 5 mg  5 mg Intravenous Q2H PRN Lorretta Harp, MD      . insulin aspart (novoLOG) injection 0-5 Units  0-5 Units Subcutaneous QHS Lorretta Harp, MD      . insulin aspart (novoLOG) injection 0-9 Units  0-9  Units Subcutaneous TID WC Lorretta Harp, MD      . morphine 2 MG/ML injection 0.5 mg  0.5 mg Intravenous Q4H PRN Lorretta Harp, MD      . nicotine (NICODERM CQ - dosed in mg/24 hours) patch 21 mg  21 mg Transdermal Daily Lorretta Harp, MD   21 mg at 12/16/20 0846  . nitroGLYCERIN (NITROSTAT) SL tablet 0.4 mg  0.4 mg Sublingual Q5 min PRN Lorretta Harp, MD      . ondansetron [facility]) injection 4 mg  4 mg Intravenous Q8H PRN Lorretta Harp, MD      . oxyCODONE-acetaminophen (PERCOCET/ROXICET) 5-325 MG per tablet 1 tablet  1 tablet Oral Q4H PRN Lorretta Harp, MD      . paliperidone (INVEGA) 24 hr tablet 9 mg  9 mg Oral QHS Lorretta Harp, MD   9 mg at 12/15/20 2150  . pravastatin (PRAVACHOL) tablet 40 mg  40 mg Oral QHS Lorretta Harp, MD   40 mg at 12/15/20 2150  . traZODone (DESYREL) tablet 50 mg  50 mg Oral QHS Lorretta Harp, MD   50 mg at 12/15/20 2150  . trihexyphenidyl (ARTANE) tablet 5 mg  5 mg Oral BID WC Lorretta Harp, MD   5 mg at 12/16/20 7681     Discharge Medications: Please see discharge summary for a list of discharge medications.  Relevant Imaging Results:  Relevant Lab Results:   Additional Information SSN: [id number]  Gildardo Griffes, LCSW

## 2020-12-16 NOTE — Evaluation (Signed)
Physical Therapy Evaluation Patient Details Name: Terrence Johnson MRN: [id number] DOB: Dec 31, 1940 Today's Date: 12/16/2020   History of Present Illness  80 y.o. male with medical history significant of HTN, HLD, DM, CKD-IIIa, schizophrenia, cognitive impairment, alcoholic dementia, tobacco abuse, who presents with weakness.     Pt is very poor historian due to history of cognitive dementia and schizophrenia.  Clinical Impression  Pt unable to answer most questions consistently, reports he doesn't walk much normally.  Called facility and they report the he is able to walk w/o AD, take a shower on his own once they get the water right; report they generally don't have to directly help him much.  Today he struggled to take a few turning steps to get bed to recliner with walker and generally speaking he showed poor awareness and activity tolerance much different from his apparent baseline.      Follow Up Recommendations SNF;Supervision/Assistance - 24 hour    Equipment Recommendations   (TBD)    Recommendations for Other Services       Precautions / Restrictions Precautions Precautions: Fall Restrictions Weight Bearing Restrictions: No      Mobility  Bed Mobility Overal bed mobility: Needs Assistance Bed Mobility: Supine to Sit     Supine to sit: Min assist     General bed mobility comments: Pt able to initiate movement and get LEs toward EOB, light assist to get to sitting, unable to don socks when requested    Transfers Overall transfer level: Needs assistance Equipment used: Rolling walker (2 wheeled) Transfers: Sit to/from Stand Sit to Stand: Min assist         General transfer comment: Pt needed cues for set up, sequencing, walker use and some physical assist.  Lacked confidence and c/o feeling weak in standing  Ambulation/Gait Ambulation/Gait assistance: Min assist Gait Distance (Feet): 3 Feet         General Gait Details: Pt was able to take a few very  cautious, shuffling, guarded steps with very close guarding/assist from PT.  Pt did not have any LOBs but certainly is not at all near his apparent baseline of ambulation ab lib w/o AD  Stairs            Wheelchair Mobility    Modified Rankin (Stroke Patients Only)       Balance Overall balance assessment: Needs assistance   Sitting balance-Leahy Scale: Good     Standing balance support: Bilateral upper extremity supported Standing balance-Leahy Scale: Poor Standing balance comment: Pt unsteady, weak and anxious with brief standing effort getting to the recliner                             Pertinent Vitals/Pain Pain Assessment: Faces Faces Pain Scale: Hurts little more Pain Location: low back and L hip    Home Living Family/patient expects to be discharged to:: Group home                      Prior Function Level of Independence: Needs assistance         Comments: he coudl vaguely say that he does some walking but could not expound.  per discussion with staff at his group home he is able to do mobility, ambulation, ADLs w/o AD and with only set up assist (water temp in the shower, etc)     Hand Dominance        Extremity/Trunk Assessment  Upper Extremity Assessment Upper Extremity Assessment: Generalized weakness;Difficult to assess due to impaired cognition    Lower Extremity Assessment Lower Extremity Assessment: Generalized weakness;Difficult to assess due to impaired cognition       Communication   Communication: Other (comment) (inconsistently able to maintain conversation)  Cognition Arousal/Alertness: Lethargic Behavior During Therapy: Flat affect Overall Cognitive Status: Difficult to assess                                 General Comments: Pt alert to self, knew it's April 2022 (but not date), when asked were he was he said "the clinic"      General Comments      Exercises     Assessment/Plan     PT Assessment Patient needs continued PT services  PT Problem List Decreased strength;Decreased range of motion;Decreased activity tolerance;Decreased balance;Decreased mobility;Decreased coordination;Decreased cognition;Decreased knowledge of use of DME;Decreased safety awareness;Pain       PT Treatment Interventions DME instruction;Gait training;Functional mobility training;Therapeutic activities;Therapeutic exercise;Balance training;Cognitive remediation;Patient/family education;Neuromuscular re-education    PT Goals (Current goals can be found in the Care Plan section)  Acute Rehab PT Goals Patient Stated Goal: unable to state PT Goal Formulation: With patient Time For Goal Achievement: 12/30/20 Potential to Achieve Goals: Fair    Frequency Min 2X/week   Barriers to discharge        Co-evaluation               AM-PAC PT "6 Clicks" Mobility  Outcome Measure Help needed turning from your back to your side while in a flat bed without using bedrails?: A Little Help needed moving from lying on your back to sitting on the side of a flat bed without using bedrails?: A Little Help needed moving to and from a bed to a chair (including a wheelchair)?: A Little Help needed standing up from a chair using your arms (e.g., wheelchair or bedside chair)?: A Little Help needed to walk in hospital room?: A Lot Help needed climbing 3-5 steps with a railing? : Total 6 Click Score: 15    End of Session Equipment Utilized During Treatment: Gait belt Activity Tolerance: Patient limited by fatigue Patient left: with chair alarm set;with call bell/phone within reach Nurse Communication: Mobility status PT Visit Diagnosis: Muscle weakness (generalized) (M62.81);Difficulty in walking, not elsewhere classified (R26.2)    Time: 5956-3875 PT Time Calculation (min) (ACUTE ONLY): 22 min   Charges:   PT Evaluation $PT Eval Low Complexity: 1 Low PT Treatments $Therapeutic Activity: 8-22  mins        Malachi Pro, DPT 12/16/2020, 11:00 AM

## 2020-12-16 NOTE — Evaluation (Signed)
Occupational Therapy Evaluation Patient Details Name: Terrence Johnson MRN: [id number] DOB: [birth year] Today's Date: 12/16/2020    History of Present Illness 80 y.o. male with medical history significant of HTN, HLD, DM, CKD-IIIa, schizophrenia, cognitive impairment, alcoholic dementia, tobacco abuse, who presents with weakness.     Pt is very poor historian due to history of cognitive dementia and schizophrenia.   Clinical Impression   Mr Gervasi was seen for OT evaluation this date. Per PT discussion with family, pt resides at group home and requires setup for showering, Independent mobility and ADLs. Pt presents to acute OT demonstrating impaired ADL performance and functional mobility 2/2 decreased activity tolerance and functional strength/ROM/balance deficits. Pt currently requires MIN A + HHA for BSC t/f. MAX A perihygiene in standing. MIN A for LB access seated EOB. Pt would benefit from skilled OT to address noted impairments and functional limitations (see below for any additional details) in order to maximize safety and independence while minimizing falls risk and caregiver burden. Upon hospital discharge, recommend STR to maximize pt safety and return to PLOF.     Follow Up Recommendations  SNF    Equipment Recommendations  Other (comment) (TBD)    Recommendations for Other Services       Precautions / Restrictions Precautions Precautions: Fall Restrictions Weight Bearing Restrictions: No      Mobility Bed Mobility Overal bed mobility: Needs Assistance Bed Mobility: Supine to Sit;Sit to Supine     Supine to sit: Min guard Sit to supine: Min guard   General bed mobility comments: increased time    Transfers Overall transfer level: Needs assistance Equipment used: Rolling walker (2 wheeled) Transfers: Sit to/from [employer] Sit to Stand: Min assist Stand pivot transfers: Min assist            Balance Overall balance assessment: Needs  assistance Sitting-balance support: No upper extremity supported;Feet supported Sitting balance-Leahy Scale: Good     Standing balance support: Bilateral upper extremity supported Standing balance-Leahy Scale: Poor Standing balance comment: unsteady, B knee buckling                           ADL either performed or assessed with clinical judgement   ADL Overall ADL's : Needs assistance/impaired                                       General ADL Comments: MIN A + HHA for BSC t/f. MAX A perihygiene in standing. MIN A for LB access seated EOB.                  Pertinent Vitals/Pain Pain Assessment: No/denies pain     Hand Dominance     Extremity/Trunk Assessment Upper Extremity Assessment Upper Extremity Assessment: Generalized weakness   Lower Extremity Assessment Lower Extremity Assessment: Generalized weakness       Communication Communication Communication: Other (comment) (mumbles)   Cognition Arousal/Alertness: Awake/alert Behavior During Therapy: Flat affect Overall Cognitive Status: Difficult to assess                                     General Comments  HR 68    Exercises Exercises: Other exercises Other Exercises Other Exercises: Pt educated re; OT role, DME recs, d/c recs, falls prevention   Shoulder Instructions  Home Living Family/patient expects to be discharged to:: Group home                                        Prior Functioning/Environment Level of Independence: Needs assistance        Comments: per PT discussion with staff at his group home he is able to do mobility, ambulation, ADLs w/o AD and with only set up assist (water temp in the shower, etc)        OT Problem List: Decreased strength;Decreased activity tolerance;Impaired balance (sitting and/or standing);Decreased safety awareness      OT Treatment/Interventions: Self-care/ADL training;Therapeutic  exercise;DME and/or AE instruction;Energy conservation;Therapeutic activities;Patient/family education;Balance training    OT Goals(Current goals can be found in the care plan section) Acute Rehab OT Goals Patient Stated Goal: unable to state OT Goal Formulation: With patient Time For Goal Achievement: 12/30/20 Potential to Achieve Goals: Fair ADL Goals Pt Will Perform Grooming: with min guard assist;standing (c LRAD PRN) Pt Will Perform Lower Body Dressing: with supervision;sit to/from stand (c LRAD PRN) Pt Will Transfer to Toilet: with modified independence;stand pivot transfer;bedside commode (c LRAD PRN)  OT Frequency: Min 1X/week   Barriers to D/C: Decreased caregiver support             AM-PAC OT "6 Clicks" Daily Activity     Outcome Measure Help from another person eating meals?: None Help from another person taking care of personal grooming?: A Little Help from another person toileting, which includes using toliet, bedpan, or urinal?: A Lot Help from another person bathing (including washing, rinsing, drying)?: A Lot Help from another person to put on and taking off regular upper body clothing?: A Little Help from another person to put on and taking off regular lower body clothing?: A Little 6 Click Score: 17   End of Session Equipment Utilized During Treatment: Rolling walker Nurse Communication: Mobility status  Activity Tolerance: Patient tolerated treatment well Patient left: in bed;with call bell/phone within reach;with bed alarm set  OT Visit Diagnosis: Other abnormalities of gait and mobility (R26.89);Muscle weakness (generalized) (M62.81)                Time: 7062-3762 OT Time Calculation (min): 18 min Charges:  OT General Charges $OT Visit: 1 Visit OT Evaluation $OT Eval Low Complexity: 1 Low OT Treatments $Self Care/Home Management : 8-22 mins  Kathie Dike, M.S. OTR/L  12/16/20, 4:25 PM  ascom [phone]

## 2020-12-16 NOTE — Consult Note (Signed)
ANTICOAGULATION CONSULT NOTE   Pharmacy Consult for Heparin Indication: chest pain/ACS  Patient Measurements: Heparin Dosing Weight: 63.5 kg  Labs: Recent Labs    12/15/20 0759 12/15/20 1118 12/15/20 1248 12/15/20 1705 12/15/20 1758 12/16/20 0622 12/16/20 1422  HGB 12.7*  --   --   --   --  12.2*  --   HCT 38.7*  --   --   --   --  36.1*  --   PLT 165  --   --   --   --  158  --   APTT  --  28  --   --   --   --   --   LABPROT  --  14.2  --   --   --   --   --   INR  --  1.1  --   --   --   --   --   HEPARINUNFRC  --   --   --   --  0.78* 0.51 0.26*  CREATININE 1.32*  --   --   --   --  1.09  --   TROPONINIHS 1,245* 1,084* 1,141* 900*  --   --   --     Estimated Creatinine Clearance: 44.1 mL/min (by C-G formula based on SCr of 1.09 mg/dL).   Medical History: Past Medical History:  Diagnosis Date  . Cognitive impairment   . COPD (chronic obstructive pulmonary disease) (HCC)   . Diabetes mellitus without complication (HCC)   . Hypercholesteremia   . Hypertension   . Psychotic disorder (HCC)    Infusions:  . heparin 700 Units/hr (12/15/20 1904)    Assessment: Pharmacy consulted to start heparin for ACS/NSTEMI. Trop 1245>1084>1141>900. No note of DOAC, only ASA/Plavix PTA. EF 50-55%, LDL 71, Heparin Dosing Weight: 63.5 kg.  Date Time HL Rate/Comment 4/26 1758 0.78 Slightly suprathera; 750>700 un/hr 4/27 0622 0.51 700 un/hr 4/27 1422 0.26 700 > 750 un/hr  Goal of Therapy:  Heparin level 0.3-0.7 units/ml Monitor platelets by anticoagulation protocol: Yes   Plan:  Anti-Xa slightly subtherapeutic. Give 950 unit bolus x1; then increase rate to 750 un/hr. Will continue pt on current rate and recheck HL in 8 hrs.  Sherlynn Carbon Charlene Detter 12/16/2020,3:17 PM

## 2020-12-17 DIAGNOSIS — E785 Hyperlipidemia, unspecified: Secondary | ICD-10-CM

## 2020-12-17 DIAGNOSIS — E1169 Type 2 diabetes mellitus with other specified complication: Secondary | ICD-10-CM

## 2020-12-17 DIAGNOSIS — E876 Hypokalemia: Secondary | ICD-10-CM

## 2020-12-17 LAB — GLUCOSE, CAPILLARY
Glucose-Capillary: 95 mg/dL (ref 70–99)
Glucose-Capillary: 88 mg/dL (ref 70–99)
Glucose-Capillary: 114 mg/dL — ABNORMAL HIGH (ref 70–99)
Glucose-Capillary: 121 mg/dL — ABNORMAL HIGH (ref 70–99)

## 2020-12-17 LAB — BASIC METABOLIC PANEL
Anion gap: 10 (ref 5–15)
BUN: 22 mg/dL (ref 8–23)
CO2: 27 mmol/L (ref 22–32)
Calcium: 9 mg/dL (ref 8.9–10.3)
Chloride: 100 mmol/L (ref 98–111)
Creatinine, Ser: 1.03 mg/dL (ref 0.61–1.24)
GFR, Estimated: >60 mL/min (ref >60)
Glucose, Bld: 96 mg/dL (ref 70–99)
Potassium: 3.3 mmol/L — ABNORMAL LOW (ref 3.5–5.1)
Sodium: 137 mmol/L (ref 135–145)

## 2020-12-17 LAB — HEPARIN LEVEL (UNFRACTIONATED): Heparin Unfractionated: <0.1 IU/mL — ABNORMAL LOW (ref 0.30–0.70)

## 2020-12-17 LAB — CK: Total CK: 53 U/L (ref 49–397)

## 2020-12-17 MED ORDER — POTASSIUM CHLORIDE CRYS ER 20 MEQ PO TBCR
40.0000 meq | EXTENDED_RELEASE_TABLET | Freq: Once | ORAL | Status: AC
  Administered 2020-12-17: 40 meq via ORAL
  Filled 2020-12-17: qty 2

## 2020-12-17 MED ORDER — IPRATROPIUM-ALBUTEROL 0.5-2.5 (3) MG/3ML IN SOLN
3.0000 mL | Freq: Four times a day (QID) | RESPIRATORY_TRACT | Status: DC
  Administered 2020-12-17 (×2): 3 mL via RESPIRATORY_TRACT
  Filled 2020-12-17 (×2): qty 3

## 2020-12-17 MED ORDER — HEPARIN BOLUS VIA INFUSION
1900.0000 [IU] | Freq: Once | INTRAVENOUS | Status: AC
  Administered 2020-12-17: 1900 [IU] via INTRAVENOUS
  Filled 2020-12-17: qty 1900

## 2020-12-17 MED ORDER — ATORVASTATIN CALCIUM 20 MG PO TABS
40.0000 mg | ORAL_TABLET | Freq: Every day | ORAL | Status: DC
  Administered 2020-12-17 – 2020-12-18 (×2): 40 mg via ORAL
  Filled 2020-12-17 (×2): qty 2

## 2020-12-17 MED ORDER — IPRATROPIUM-ALBUTEROL 0.5-2.5 (3) MG/3ML IN SOLN
3.0000 mL | Freq: Three times a day (TID) | RESPIRATORY_TRACT | Status: DC
  Administered 2020-12-18 (×2): 3 mL via RESPIRATORY_TRACT
  Filled 2020-12-17: qty 3

## 2020-12-17 NOTE — Clinical Social Work Note (Signed)
RE:  Terrence Johnson   Date of Birth:   05-16-1942___________  Date: 12/17/2020       To Whom It May Concern:  Please be advised that the above-named patient will require a short-term nursing home stay - anticipated 30 days or less for rehabilitation and strengthening.  The plan is for return home.

## 2020-12-17 NOTE — Progress Notes (Signed)
Patient ID: Terrence Johnson, male   DOB: 1940/11/06, 80 y.o.   MRN: [id number] [facility] PROGRESS NOTE  Terrence Johnson YPP:[id number] DOB: 1941/05/05 DOA: 12/15/2020 PCP: Patient, No Pcp Per (Inactive)  HPI/Subjective: Patient feels okay.  Offers no complaints.  Still little weak.  No complaints of chest pain or shortness of breath.  Admitted with NSTEMI and heparin drip will stop at 5 PM.  Objective: Vitals:   12/17/20 0819 12/17/20 1201  BP: 126/63 (!) 148/73  Pulse: 62 65  Resp: 18 16  Temp: 97.9 F (36.6 C) 98.1 F (36.7 C)  SpO2: 100% 100%    Intake/Output Summary (Last 24 hours) at 12/17/2020 1619 Last data filed at 12/17/2020 1021 Gross per 24 hour  Intake 240 ml  Output 1000 ml  Net -760 ml   Filed Weights   12/15/20 0755 12/17/20 1142  Weight: 63.5 kg 47.3 kg    ROS: Review of Systems  Respiratory: Negative for shortness of breath.   Cardiovascular: Negative for chest pain.  Gastrointestinal: Negative for abdominal pain, nausea and vomiting.   Exam: Physical Exam HENT:     Head: Normocephalic.     Mouth/Throat:     Pharynx: No oropharyngeal exudate.  Eyes:     General: Lids are normal.     Conjunctiva/sclera: Conjunctivae normal.     Pupils: Pupils are equal, round, and reactive to light.  Cardiovascular:     Rate and Rhythm: Normal rate and regular rhythm.     Heart sounds: Normal heart sounds, S1 normal and S2 normal.  Pulmonary:     Breath sounds: Normal breath sounds. No decreased breath sounds, wheezing, rhonchi or rales.  Abdominal:     Palpations: Abdomen is soft.     Tenderness: There is no abdominal tenderness.  Musculoskeletal:     Right ankle: No swelling.     Left ankle: No swelling.  Skin:    General: Skin is warm.     Findings: No rash.  Neurological:     Mental Status: He is alert and oriented to person, place, and time.       Data Reviewed: Basic Metabolic Panel: Recent Labs  Lab 12/15/20 0759 12/16/20 0622  12/17/20 0430  NA 138 139 137  K 3.6 3.5 3.3*  CL 99 101 100  CO2 29 28 27   GLUCOSE 124* 99 96  BUN 23 22 22   CREATININE 1.32* 1.09 1.03  CALCIUM 9.8 9.2 9.0   Liver Function Tests: Recent Labs  Lab 12/15/20 0759  AST 22  ALT 10  ALKPHOS 54  BILITOT 0.7  PROT 7.6  ALBUMIN 3.9    Recent Labs  Lab 12/15/20 0800  AMMONIA <9*   CBC: Recent Labs  Lab 12/15/20 0759 12/16/20 0622  WBC 5.4 4.4  NEUTROABS 3.2  --   HGB 12.7* 12.2*  HCT 38.7* 36.1*  MCV 95.3 94.3  PLT 165 158   Cardiac Enzymes: Recent Labs  Lab 12/17/20 0430  CKTOTAL 53   BNP (last 3 results) Recent Labs    12/15/20 0759  BNP 80.1    CBG: Recent Labs  Lab 12/16/20 1154 12/16/20 1647 12/16/20 2040 12/17/20 0821 12/17/20 1202  GLUCAP 103* 128* 75 95 88    Recent Results (from the past 240 hour(s))  Resp Panel by RT-PCR (Flu A&B, Covid) Nasopharyngeal Swab     Status: None   Collection Time: 12/15/20  9:35 AM   Specimen: Nasopharyngeal Swab; Nasopharyngeal(NP) swabs in vial transport medium  Result Value Ref  Range Status   SARS Coronavirus 2 by RT PCR NEGATIVE NEGATIVE Final    Comment: (NOTE) SARS-CoV-2 target nucleic acids are NOT DETECTED.  The SARS-CoV-2 RNA is generally detectable in upper respiratory specimens during the acute phase of infection. The lowest concentration of SARS-CoV-2 viral copies this assay can detect is 138 copies/mL. A negative result does not preclude SARS-Cov-2 infection and should not be used as the sole basis for treatment or other patient management decisions. A negative result may occur with  improper specimen collection/handling, submission of specimen other than nasopharyngeal swab, presence of viral mutation(s) within the areas targeted by this assay, and inadequate number of viral copies(<138 copies/mL). A negative result must be combined with clinical observations, patient history, and epidemiological information. The expected result is  Negative.  Fact Sheet for Patients:  BloggerCourse.com  Fact Sheet for Healthcare Providers:  SeriousBroker.it  This test is no t yet approved or cleared by the [place] FDA and  has been authorized for detection and/or diagnosis of SARS-CoV-2 by FDA under an Emergency Use Authorization (EUA). This EUA will remain  in effect (meaning this test can be used) for the duration of the COVID-19 declaration under Section 564(b)(1) of the Act, 21 U.S.C.section 360bbb-3(b)(1), unless the authorization is terminated  or revoked sooner.       Influenza A by PCR NEGATIVE NEGATIVE Final   Influenza B by PCR NEGATIVE NEGATIVE Final    Comment: (NOTE) The Xpert Xpress SARS-CoV-2/FLU/RSV plus assay is intended as an aid in the diagnosis of influenza from Nasopharyngeal swab specimens and should not be used as a sole basis for treatment. Nasal washings and aspirates are unacceptable for Xpert Xpress SARS-CoV-2/FLU/RSV testing.  Fact Sheet for Patients: BloggerCourse.com  Fact Sheet for Healthcare Providers: SeriousBroker.it  This test is not yet approved or cleared by the [place] FDA and has been authorized for detection and/or diagnosis of SARS-CoV-2 by FDA under an Emergency Use Authorization (EUA). This EUA will remain in effect (meaning this test can be used) for the duration of the COVID-19 declaration under Section 564(b)(1) of the Act, 21 U.S.C. section 360bbb-3(b)(1), unless the authorization is terminated or revoked.  Performed at [facility], [address] [place], [place] [address]   MRSA PCR Screening     Status: None   Collection Time: 12/15/20  6:44 PM   Specimen: Nasal Mucosa; Nasopharyngeal  Result Value Ref Range Status   MRSA by PCR NEGATIVE NEGATIVE Final    Comment:        The GeneXpert MRSA Assay (FDA approved for NASAL specimens only), is  one component of a comprehensive MRSA colonization surveillance program. It is not intended to diagnose MRSA infection nor to guide or monitor treatment for MRSA infections. Performed at [facility], [address] [place], [place] [address]      Scheduled Meds: . amLODipine  5 mg Oral Daily  . aspirin EC  81 mg Oral Daily  . benztropine  0.5 mg Oral BID  . carvedilol  12.5 mg Oral BID WC  . clopidogrel  75 mg Oral Daily  . donepezil  10 mg Oral Daily  . insulin aspart  0-5 Units Subcutaneous QHS  . insulin aspart  0-9 Units Subcutaneous TID WC  . ipratropium-albuterol  3 mL Nebulization Q6H  . nicotine  21 mg Transdermal Daily  . paliperidone  9 mg Oral QHS  . pravastatin  40 mg Oral QHS  . traZODone  50 mg Oral QHS  .  trihexyphenidyl  5 mg Oral BID WC   Continuous Infusions: . heparin 950 Units/hr (12/17/20 0731)    Assessment/Plan:  1. NSTEMI.  Medical management.  Heparin drip will end today at 5 PM.  Continue aspirin, Plavix, Coreg.  Change pravastatin to atorvastatin.  LDL 71. 2. Weakness.  Physical therapy still recommending rehab.  CPK normal range. 3. Type 2 diabetes mellitus with hyperlipidemia.  Change pravastatin to atorvastatin.  Hemoglobin A1c actually low at 6.2.  Should be able to come off some of the diabetic medications as outpatient.  Currently on sliding scale here. 4. Acute kidney injury on chronic kidney disease stage II.  Creatinine up at 1.32 on presentation.  Prior to coming in with 0.95.  Today's creatinine 1.03. 5. Hypokalemia replace potassium today 6. History of psychotic disorder.  Continue psychiatric medication        Code Status:     Code Status Orders  (From admission, onward)         Start     Ordered   12/15/20 1745  Full code  Continuous        12/15/20 1744        Code Status History    Date Active Date Inactive Code Status Order ID Comments User Context   02/16/2017 2058 02/20/2017 1514 Full Code [id number]   Alford Highland, MD ED   Advance Care Planning Activity     Family Communication: Unable to reach family Disposition Plan: Status is: Inpatient  Dispo: The patient is from: Group home              Anticipated d/c is to: Rehab, potentially for tomorrow              Patient currently on heparin drip for NSTEMI this should in this evening.   Difficult to place patient.  No.  Time spent: 27 minutes  [employer]

## 2020-12-17 NOTE — Progress Notes (Signed)
PT Cancellation Note  Patient Details Name: Terrence Johnson MRN: [id number] DOB: 10/24/1940   Cancelled Treatment:    Reason Eval/Treat Not Completed: Patient declined, no reason specified Attempted to see pt this afternoon but he was absolutely not interested.  He was very slumped down in bed, assisted him in getting to appropriate position, however made him assist a good bit with his LEs and Trendelenberged bed.  Pt's dinner looked untouched, offered to assist with getting anything open, cut, etc but he said he didn't want to eat.  Will maintain on caseload and see as appropriate.  Malachi Pro, DPT 12/17/2020, 5:42 PM

## 2020-12-17 NOTE — Consult Note (Signed)
ANTICOAGULATION CONSULT NOTE   Pharmacy Consult for Heparin Indication: chest pain/ACS  Patient Measurements: Heparin Dosing Weight: 63.5 kg  Labs: Recent Labs    12/15/20 0759 12/15/20 1118 12/15/20 1248 12/15/20 1705 12/15/20 1758 12/16/20 0622 12/16/20 1422 12/16/20 2332 12/17/20 0430  HGB 12.7*  --   --   --   --  12.2*  --   --   --   HCT 38.7*  --   --   --   --  36.1*  --   --   --   PLT 165  --   --   --   --  158  --   --   --   APTT  --  28  --   --   --   --   --   --   --   LABPROT  --  14.2  --   --   --   --   --   --   --   INR  --  1.1  --   --   --   --   --   --   --   HEPARINUNFRC  --   --   --   --    < > 0.51 0.26* <0.10*  --   CREATININE 1.32*  --   --   --   --  1.09  --   --  1.03  CKTOTAL  --   --   --   --   --   --   --   --  53  TROPONINIHS 1,245* 1,084* 1,141* 900*  --   --   --   --   --    < > = values in this interval not displayed.    Estimated Creatinine Clearance: 46.7 mL/min (by C-G formula based on SCr of 1.03 mg/dL).   Medical History: Past Medical History:  Diagnosis Date  . Cognitive impairment   . COPD (chronic obstructive pulmonary disease) (HCC)   . Diabetes mellitus without complication (HCC)   . Hypercholesteremia   . Hypertension   . Psychotic disorder (HCC)    Infusions:  . heparin 750 Units/hr (12/16/20 2149)    Assessment: Pharmacy consulted to start heparin for ACS/NSTEMI. Trop 1245>1084>1141>900. No note of DOAC, only ASA/Plavix PTA. EF 50-55%, LDL 71, Heparin Dosing Weight: 63.5 kg.  Date Time HL Rate/Comment 4/26 1758 0.78 Slightly suprathera; 750>700 un/hr 4/27 0622 0.51 700 un/hr 4/27 1422 0.26 700 > 750 un/hr 4/27 2332 <0.1 750 > 950 un/hr   Goal of Therapy:  Heparin level 0.3-0.7 units/ml Monitor platelets by anticoagulation protocol: Yes   Plan:  Anti-Xa subtherapeutic. Give 1900 unit bolus x1; then increase rate to 950 un/hr. Will recheck HL in 8 hrs after rate increase.  Terrence Johnson  Terrence Johnson 12/17/2020,7:19 AM

## 2020-12-18 DIAGNOSIS — J181 Lobar pneumonia, unspecified organism: Secondary | ICD-10-CM

## 2020-12-18 LAB — CBC
HCT: 35.5 % — ABNORMAL LOW (ref 39.0–52.0)
Hemoglobin: 11.9 g/dL — ABNORMAL LOW (ref 13.0–17.0)
MCH: 31.6 pg (ref 26.0–34.0)
MCHC: 33.5 g/dL (ref 30.0–36.0)
MCV: 94.2 fL (ref 80.0–100.0)
Platelets: 142 10*3/uL — ABNORMAL LOW (ref 150–400)
RBC: 3.77 MIL/uL — ABNORMAL LOW (ref 4.22–5.81)
RDW: 14.5 % (ref 11.5–15.5)
WBC: 4.6 10*3/uL (ref 4.0–10.5)
nRBC: 0 % (ref 0.0–0.2)

## 2020-12-18 LAB — BASIC METABOLIC PANEL
Anion gap: 8 (ref 5–15)
BUN: 22 mg/dL (ref 8–23)
CO2: 26 mmol/L (ref 22–32)
Calcium: 9.4 mg/dL (ref 8.9–10.3)
Chloride: 103 mmol/L (ref 98–111)
Creatinine, Ser: 1.27 mg/dL — ABNORMAL HIGH (ref 0.61–1.24)
GFR, Estimated: 57 mL/min — ABNORMAL LOW (ref >60)
Glucose, Bld: 103 mg/dL — ABNORMAL HIGH (ref 70–99)
Potassium: 3.9 mmol/L (ref 3.5–5.1)
Sodium: 137 mmol/L (ref 135–145)

## 2020-12-18 LAB — GLUCOSE, CAPILLARY
Glucose-Capillary: 104 mg/dL — ABNORMAL HIGH (ref 70–99)
Glucose-Capillary: 115 mg/dL — ABNORMAL HIGH (ref 70–99)
Glucose-Capillary: 113 mg/dL — ABNORMAL HIGH (ref 70–99)
Glucose-Capillary: 121 mg/dL — ABNORMAL HIGH (ref 70–99)

## 2020-12-18 LAB — MAGNESIUM: Magnesium: 1.7 mg/dL (ref 1.7–2.4)

## 2020-12-18 LAB — SARS CORONAVIRUS 2 (TAT 6-24 HRS): SARS Coronavirus 2: NEGATIVE

## 2020-12-18 MED ORDER — CEFDINIR 300 MG PO CAPS
300.0000 mg | ORAL_CAPSULE | Freq: Two times a day (BID) | ORAL | Status: DC
  Administered 2020-12-18 (×2): 300 mg via ORAL
  Filled 2020-12-18 (×3): qty 1

## 2020-12-18 MED ORDER — AZITHROMYCIN 250 MG PO TABS
500.0000 mg | ORAL_TABLET | Freq: Every day | ORAL | Status: AC
  Administered 2020-12-18: 500 mg via ORAL
  Filled 2020-12-18: qty 2

## 2020-12-18 MED ORDER — NICOTINE 21 MG/24HR TD PT24: 21.0000 mg | MEDICATED_PATCH | Freq: Every day | TRANSDERMAL | 0 refills | Status: DC

## 2020-12-18 MED ORDER — CEFDINIR 300 MG PO CAPS: 300.0000 mg | ORAL_CAPSULE | Freq: Two times a day (BID) | ORAL | 0 refills | Status: AC

## 2020-12-18 MED ORDER — AMLODIPINE BESYLATE 5 MG PO TABS: 5.0000 mg | ORAL_TABLET | Freq: Every day | ORAL | 0 refills | Status: DC

## 2020-12-18 MED ORDER — AZITHROMYCIN 250 MG PO TABS: ORAL_TABLET | ORAL | 0 refills | Status: DC

## 2020-12-18 MED ORDER — SODIUM CHLORIDE 0.9 % IV BOLUS
250.0000 mL | Freq: Once | INTRAVENOUS | Status: AC
  Administered 2020-12-18: 250 mL via INTRAVENOUS

## 2020-12-18 MED ORDER — AZITHROMYCIN 250 MG PO TABS: 250.0000 mg | ORAL_TABLET | Freq: Every day | ORAL | Status: DC

## 2020-12-18 MED ORDER — ATORVASTATIN CALCIUM 20 MG PO TABS: 40.0000 mg | ORAL_TABLET | Freq: Every day | ORAL | 0 refills | Status: DC

## 2020-12-18 NOTE — Progress Notes (Signed)
EMS came pick the pt up. Discharge packet was handed to the EMS personnels. Per days shift report was given to to the [facility] staff. Pt IV was removed. Pt was transported out of the hospital via stretcher by EMS personnel.

## 2020-12-18 NOTE — Discharge Summary (Signed)
[facility] [place] [facility]   PATIENT NAME: Terrence Johnson    MR#:  [id number]  DATE OF BIRTH:  1940-08-25  DATE OF ADMISSION:  12/15/2020 ADMITTING PHYSICIAN: Lorretta Harp, MD  DATE OF DISCHARGE: 12/18/2020  PRIMARY CARE PHYSICIAN: Patient, No Pcp Per (Inactive)    ADMISSION DIAGNOSIS:  Fall [W19.XXXA] NSTEMI (non-ST elevated myocardial infarction) (HCC) [I21.4] Generalized weakness [R53.1]  DISCHARGE DIAGNOSIS:  Principal Problem:   NSTEMI (non-ST elevated myocardial infarction) (HCC) Active Problems:   Schizophrenia (HCC)   Cognitive impairment   Type 2 diabetes mellitus with hyperlipidemia (HCC)   CKD (chronic kidney disease), stage IIIa   HLD (hyperlipidemia)   Hypertension   COPD (chronic obstructive pulmonary disease) (HCC)   Weakness   CKD stage 2 due to type 2 diabetes mellitus (HCC)   Acute kidney injury superimposed on CKD (HCC)   Hypokalemia   SECONDARY DIAGNOSIS:   Past Medical History:  Diagnosis Date  . Cognitive impairment   . COPD (chronic obstructive pulmonary disease) (HCC)   . Diabetes mellitus without complication (HCC)   . Hypercholesteremia   . Hypertension   . Psychotic disorder [facility])     HOSPITAL COURSE:   1.  NSTEMI.  Patient was treated with medical management.  The patient had heparin drip for 48 hours.  Continue aspirin Plavix and Coreg.  Patient's pravastatin was changed to atorvastatin.  The patient's LDL is acceptable at 71.  Cardiopulmonary rehab once out of rehab facility. 2.  Weakness.  Physical therapy still recommending rehab.  CPK normal range. 3.  Possibility of pneumonia left lower lobe.  We will give a quick course of antibiotics with Omnicef and Zithromax.  End date May 3. 4.  Type 2 diabetes mellitus with hyperlipidemia.  I changed pravastatin over to atorvastatin.  Hemoglobin A1c actually low.  Continue to watch off oral medications.  Check fingersticks daily. 5.  Acute kidney injury on chronic kidney  disease stage II versus 3A.  Patient's creatinine 1.32 on presentation and came down to 1.03 yesterday.  Creatinine up to 1.27 today.  I will give a fluid bolus prior to disposition. 6.  Hypokalemia replaced during the hospital course 7.  History of psychotic disorder.  Continue psychiatric medications as he was previously on.  DISCHARGE CONDITIONS:   Satisfactory  CONSULTS OBTAINED:  Cardiology Dr. Welton Flakes  DRUG ALLERGIES:  No Known Allergies  DISCHARGE MEDICATIONS:   Allergies as of 12/18/2020   No Known Allergies     Medication List    STOP taking these medications   chlorthalidone 25 MG tablet Commonly known as: HYGROTON   Farxiga 10 MG Tabs tablet Generic drug: dapagliflozin propanediol   lisinopril 20 MG tablet Commonly known as: ZESTRIL   metFORMIN 500 MG tablet Commonly known as: GLUCOPHAGE   naproxen 500 MG tablet Commonly known as: NAPROSYN   pravastatin 40 MG tablet Commonly known as: PRAVACHOL     TAKE these medications   acetaminophen 325 MG tablet Commonly known as: TYLENOL Take 650 mg by mouth every 6 (six) hours as needed.   amLODipine 5 MG tablet Commonly known as: NORVASC Take 1 tablet (5 mg total) by mouth daily. Start taking on: December 19, 2020   aspirin EC 81 MG tablet Take 81 mg by mouth daily.   atorvastatin 20 MG tablet Commonly known as: LIPITOR Take 2 tablets (40 mg total) by mouth at bedtime.   azithromycin 250 MG tablet Commonly known as: ZITHROMAX One tab po daily for four  days Start taking on: December 19, 2020   benztropine 0.5 MG tablet Commonly known as: COGENTIN Take 0.5 mg by mouth 2 (two) times daily.   carvedilol 12.5 MG tablet Commonly known as: COREG Take 12.5 mg by mouth 2 (two) times daily with a meal.   cefdinir 300 MG capsule Commonly known as: OMNICEF Take 1 capsule (300 mg total) by mouth every 12 (twelve) hours for 5 days.   clopidogrel 75 MG tablet Commonly known as: PLAVIX Take 75 mg by mouth  daily.   donepezil 10 MG tablet Commonly known as: ARICEPT Take 10 mg by mouth daily.   Ipratropium-Albuterol 20-100 MCG/ACT Aers respimat Commonly known as: COMBIVENT Inhale 1 puff into the lungs 2 (two) times daily.   nicotine 21 mg/24hr patch Commonly known as: NICODERM CQ - dosed in mg/24 hours Place 1 patch (21 mg total) onto the skin daily. Start taking on: December 19, 2020   paliperidone 9 MG 24 hr tablet Commonly known as: INVEGA Take 9 mg by mouth at bedtime.   traZODone 50 MG tablet Commonly known as: DESYREL Take 50 mg by mouth at bedtime.   trihexyphenidyl 5 MG tablet Commonly known as: ARTANE Take 5 mg by mouth 2 (two) times daily with a meal.        DISCHARGE INSTRUCTIONS:   Follow-up team at rehab 1 day  If you experience worsening of your admission symptoms, develop shortness of breath, life threatening emergency, suicidal or homicidal thoughts you must seek medical attention immediately by calling 911 or calling your MD immediately  if symptoms less severe.  You Must read complete instructions/literature along with all the possible adverse reactions/side effects for all the Medicines you take and that have been prescribed to you. Take any new Medicines after you have completely understood and accept all the possible adverse reactions/side effects.   Please note  You were cared for by a hospitalist during your hospital stay. If you have any questions about your discharge medications or the care you received while you were in the hospital after you are discharged, you can call the unit and asked to speak with the hospitalist on call if the hospitalist that took care of you is not available. Once you are discharged, your primary care physician will handle any further medical issues. Please note that NO REFILLS for any discharge medications will be authorized once you are discharged, as it is imperative that you return to your primary care physician (or establish a  relationship with a primary care physician if you do not have one) for your aftercare needs so that they can reassess your need for medications and monitor your lab values.    Today   CHIEF COMPLAINT:   Chief Complaint  Patient presents with  . Weakness    HISTORY OF PRESENT ILLNESS:  Terrence Johnson  is a 80 y.o. male came in with weakness   VITAL SIGNS:  Blood pressure 115/80, pulse 83, temperature 98.6 F (37 C), resp. rate 18, weight 47.3 kg, SpO2 100 %.  I/O:    Intake/Output Summary (Last 24 hours) at 12/18/2020 1027 Last data filed at 12/18/2020 0945 Gross per 24 hour  Intake 480 ml  Output 300 ml  Net 180 ml    PHYSICAL EXAMINATION:  GENERAL:  80 y.o.-year-old patient lying in the bed with no acute distress.  EYES: Pupils equal, round, reactive to light and accommodation. No scleral icterus. HEENT: Head atraumatic, normocephalic. Oropharynx and nasopharynx clear.   LUNGS: Normal  breath sounds bilaterally, no wheezing, rales,rhonchi or crepitation. No use of accessory muscles of respiration.  CARDIOVASCULAR: S1, S2 normal. No murmurs, rubs, or gallops.  ABDOMEN: Soft, non-tender, non-distended.  EXTREMITIES: No pedal edema.  NEUROLOGIC: Cranial nerves II through XII are intact. PSYCHIATRIC: The patient is alert and oriented x 3.  SKIN: No obvious rash, lesion, or ulcer.   DATA REVIEW:   CBC Recent Labs  Lab 12/18/20 0412  WBC 4.6  HGB 11.9*  HCT 35.5*  PLT 142*    Chemistries  Recent Labs  Lab 12/15/20 0759 12/16/20 0622 12/18/20 0412  NA 138   < > 137  K 3.6   < > 3.9  CL 99   < > 103  CO2 29   < > 26  GLUCOSE 124*   < > 103*  BUN 23   < > 22  CREATININE 1.32*   < > 1.27*  CALCIUM 9.8   < > 9.4  MG  --   --  1.7  AST 22  --   --   ALT 10  --   --   ALKPHOS 54  --   --   BILITOT 0.7  --   --    < > = values in this interval not displayed.     Microbiology Results  Results for orders placed or performed during the hospital encounter  of 12/15/20  Resp Panel by RT-PCR (Flu A&B, Covid) Nasopharyngeal Swab     Status: None   Collection Time: 12/15/20  9:35 AM   Specimen: Nasopharyngeal Swab; Nasopharyngeal(NP) swabs in vial transport medium  Result Value Ref Range Status   SARS Coronavirus 2 by RT PCR NEGATIVE NEGATIVE Final    Comment: (NOTE) SARS-CoV-2 target nucleic acids are NOT DETECTED.  The SARS-CoV-2 RNA is generally detectable in upper respiratory specimens during the acute phase of infection. The lowest concentration of SARS-CoV-2 viral copies this assay can detect is 138 copies/mL. A negative result does not preclude SARS-Cov-2 infection and should not be used as the sole basis for treatment or other patient management decisions. A negative result may occur with  improper specimen collection/handling, submission of specimen other than nasopharyngeal swab, presence of viral mutation(s) within the areas targeted by this assay, and inadequate number of viral copies(<138 copies/mL). A negative result must be combined with clinical observations, patient history, and epidemiological information. The expected result is Negative.  Fact Sheet for Patients:  BloggerCourse.com  Fact Sheet for Healthcare Providers:  SeriousBroker.it  This test is no t yet approved or cleared by the [place] FDA and  has been authorized for detection and/or diagnosis of SARS-CoV-2 by FDA under an Emergency Use Authorization (EUA). This EUA will remain  in effect (meaning this test can be used) for the duration of the COVID-19 declaration under Section 564(b)(1) of the Act, 21 U.S.C.section 360bbb-3(b)(1), unless the authorization is terminated  or revoked sooner.       Influenza A by PCR NEGATIVE NEGATIVE Final   Influenza B by PCR NEGATIVE NEGATIVE Final    Comment: (NOTE) The Xpert Xpress SARS-CoV-2/FLU/RSV plus assay is intended as an aid in the diagnosis of influenza  from Nasopharyngeal swab specimens and should not be used as a sole basis for treatment. Nasal washings and aspirates are unacceptable for Xpert Xpress SARS-CoV-2/FLU/RSV testing.  Fact Sheet for Patients: BloggerCourse.com  Fact Sheet for Healthcare Providers: SeriousBroker.it  This test is not yet approved or cleared by the [place] FDA and has  been authorized for detection and/or diagnosis of SARS-CoV-2 by FDA under an Emergency Use Authorization (EUA). This EUA will remain in effect (meaning this test can be used) for the duration of the COVID-19 declaration under Section 564(b)(1) of the Act, 21 U.S.C. section 360bbb-3(b)(1), unless the authorization is terminated or revoked.  Performed at [facility], [address] [place], [place] [address]   MRSA PCR Screening     Status: None   Collection Time: 12/15/20  6:44 PM   Specimen: Nasal Mucosa; Nasopharyngeal  Result Value Ref Range Status   MRSA by PCR NEGATIVE NEGATIVE Final    Comment:        The GeneXpert MRSA Assay (FDA approved for NASAL specimens only), is one component of a comprehensive MRSA colonization surveillance program. It is not intended to diagnose MRSA infection nor to guide or monitor treatment for MRSA infections. Performed at [facility], [address] [place], [place] [address]      Management plans discussed with the patient and he is in agreement.  CODE STATUS:     Code Status Orders  (From admission, onward)         Start     Ordered   12/15/20 1745  Full code  Continuous        12/15/20 1744        Code Status History    Date Active Date Inactive Code Status Order ID Comments User Context   02/16/2017 2058 02/20/2017 1514 Full Code [id number]  Alford Highland, MD ED   Advance Care Planning Activity      TOTAL TIME TAKING CARE OF THIS PATIENT: 33 minutes.    Alford Highland M.D on 12/18/2020 at  10:27 AM  Between 7am to 6pm - Pager - [phone]  After 6pm go to [email or web address] - password [email or web address]  [facility]  CC: Primary care physician; Patient, No Pcp Per (Inactive)

## 2020-12-18 NOTE — TOC Transition Note (Signed)
Transition of Care [facility]) - CM/SW Discharge Note   Patient Details  Name: Terrence Johnson MRN: [id number] Date of Birth: 10-Sep-1940  Transition [facility] Care [facility]) CM/SW Contact:  Maree Krabbe, LCSW Phone Number: 12/18/2020, 3:22 PM   Clinical Narrative:   Clinical Social Worker facilitated patient discharge including contacting patient family and facility to confirm patient discharge plans.  Clinical information faxed to facility and family agreeable with plan.  CSW arranged ambulance transport via [facility] to [facility] for 7:00 to make sure we have covid test back. RN given the number for [facility] in the case they need to change the time.  RN to call [phone] for report prior to discharge.     Final next level of care: Skilled Nursing Facility Barriers to Discharge: No Barriers Identified   Patient Goals and CMS Choice   CMS Medicare.gov Compare Post Acute Care list provided to:: Patient Represenative (must comment) (patient's brother Archie) Choice offered to / list presented to : Sibling  Discharge Placement              Patient chooses bed at:  [facility]) Patient to be transferred to facility by: [facility] Name of family member notified: attempted to reach pt's brother several times Patient and family notified of of transfer: 12/18/20  Discharge Plan and Services     Post Acute Care Choice: Skilled Nursing Facility                               Social Determinants of Health (SDOH) Interventions     Readmission Risk Interventions No flowsheet data found.

## 2020-12-18 NOTE — Care Management Important Message (Signed)
Important Message  Patient Details  Name: Terrence Johnson MRN: [id number] Date of Birth: 08-Feb-1941   Medicare Important Message Given:  Yes     [name] 12/18/2020, 11:32 AM

## 2020-12-18 NOTE — Progress Notes (Signed)
SUBJECTIVE: Patient is much more alert and oriented today and denies any chest pain or shortness of breath.    Vitals:   12/17/20 2140 12/18/20 0435 12/18/20 0733 12/18/20 0740  BP: (!) 146/78 110/66 115/80   Pulse: 63 71 (!) 132 83  Resp: 20 18 18    Temp: 98.2 F (36.8 C) 97.9 F (36.6 C) 98.6 F (37 C)   TempSrc: Oral Oral    SpO2: 95% 100% 100%   Weight:        Intake/Output Summary (Last 24 hours) at 12/18/2020 1040 Last data filed at 12/18/2020 0945 Gross per 24 hour  Intake 480 ml  Output 300 ml  Net 180 ml    LABS: Basic Metabolic Panel: Recent Labs    12/17/20 0430 12/18/20 0412  NA 137 137  K 3.3* 3.9  CL 100 103  CO2 27 26  GLUCOSE 96 103*  BUN 22 22  CREATININE 1.03 1.27*  CALCIUM 9.0 9.4  MG  --  1.7   Liver Function Tests: No results for input(s): AST, ALT, ALKPHOS, BILITOT, PROT, ALBUMIN in the last 72 hours. No results for input(s): LIPASE, AMYLASE in the last 72 hours. CBC: Recent Labs    12/16/20 0622 12/18/20 0412  WBC 4.4 4.6  HGB 12.2* 11.9*  HCT 36.1* 35.5*  MCV 94.3 94.2  PLT 158 142*   Cardiac Enzymes: Recent Labs    12/17/20 0430  CKTOTAL 53   BNP: Invalid input(s): POCBNP D-Dimer: No results for input(s): DDIMER in the last 72 hours. Hemoglobin A1C: Recent Labs    12/16/20 0622  HGBA1C 6.2*   Fasting Lipid Panel: Recent Labs    12/16/20 0622  CHOL 119  HDL 40*  LDLCALC 71  TRIG 42  CHOLHDL 3.0   Thyroid Function Tests: No results for input(s): TSH, T4TOTAL, T3FREE, THYROIDAB in the last 72 hours.  Invalid input(s): FREET3 Anemia Panel: No results for input(s): VITAMINB12, FOLATE, FERRITIN, TIBC, IRON, RETICCTPCT in the last 72 hours.   PHYSICAL EXAM General: Well developed, well nourished, in no acute distress HEENT:  Normocephalic and atramatic Neck:  No JVD.  Lungs: Clear bilaterally to auscultation and percussion. Heart: HRRR . Normal S1 and S2 without gallops or murmurs.  Abdomen: Bowel sounds  are positive, abdomen soft and non-tender  Msk:  Back normal, normal gait. Normal strength and tone for age. Extremities: No clubbing, cyanosis or edema.   Neuro: Alert and oriented X 3. 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  Decided to treat medically and not do cardiac cath.  May be able to do noninvasive test such as CTA coronaries in the office upon discharge.  Patient can technically go home on current medications with follow-up in the office Tuesday of next week at 9:00.  Principal Problem:   NSTEMI (non-ST elevated myocardial infarction) (HCC) Active Problems:   Schizophrenia (HCC)   Cognitive impairment   Type 2 diabetes mellitus with hyperlipidemia (HCC)   CKD (chronic kidney disease), stage IIIa   HLD (hyperlipidemia)   Hypertension   COPD (chronic obstructive pulmonary disease) (HCC)   Weakness   CKD stage 2 due to type 2 diabetes mellitus (HCC)   Acute kidney injury superimposed on CKD (HCC)   Hypokalemia   Lobar pneumonia (HCC)    Jarom Govan A, MD, [facility] 12/18/2020 10:40 AM

## 2020-12-18 NOTE — TOC Transition Note (Signed)
Transition of Care [facility]) - CM/SW Discharge Note   Patient Details  Name: Terrence Johnson MRN: [id number] Date of Birth: [birth year]  Transition of Care [facility]) CM/SW Contact:  Maree Krabbe, LCSW Phone Number: 12/18/2020, 11:25 AM   Clinical Narrative:   Clinical Social Worker facilitated patient discharge including contacting patient family and facility to confirm patient discharge plans.  Clinical information faxed to facility and family agreeable with plan.  CSW arranged ambulance transport via [facility] to [facility] .  RN to call [phone] for report prior to discharge.     Final next level of care: Skilled Nursing Facility Barriers to Discharge: No Barriers Identified   Patient Goals and CMS Choice   CMS Medicare.gov Compare Post Acute Care list provided to:: Patient Represenative (must comment) (patient's brother Archie) Choice offered to / list presented to : Sibling  Discharge Placement              [facility] Name of family member notified:  (brother) Patient and family notified of of transfer: 12/18/20  Discharge Plan and Services     Post Acute Care Choice: Skilled Nursing Facility                               Social Determinants of Health (SDOH) Interventions     Readmission Risk Interventions No flowsheet data found.

## 2021-01-16 ENCOUNTER — Emergency Department
Admission: EM | Admit: 2021-01-16 | Discharge: 2021-01-16 | Disposition: A | Discharge status: Home / Self Care | Payer: Medicare Other | Attending: Emergency Medicine | Admitting: Emergency Medicine

## 2021-01-16 ENCOUNTER — Other Ambulatory Visit: Payer: Self-pay

## 2021-01-16 ENCOUNTER — Emergency Department: Payer: Medicare Other

## 2021-01-16 DIAGNOSIS — Z20822 Contact with and (suspected) exposure to covid-19: Secondary | ICD-10-CM | POA: Insufficient documentation

## 2021-01-16 DIAGNOSIS — F17200 Nicotine dependence, unspecified, uncomplicated: Secondary | ICD-10-CM | POA: Diagnosis not present

## 2021-01-16 DIAGNOSIS — E785 Hyperlipidemia, unspecified: Secondary | ICD-10-CM | POA: Diagnosis not present

## 2021-01-16 DIAGNOSIS — Z7951 Long term (current) use of inhaled steroids: Secondary | ICD-10-CM | POA: Insufficient documentation

## 2021-01-16 DIAGNOSIS — E1122 Type 2 diabetes mellitus with diabetic chronic kidney disease: Secondary | ICD-10-CM | POA: Insufficient documentation

## 2021-01-16 DIAGNOSIS — Z7982 Long term (current) use of aspirin: Secondary | ICD-10-CM | POA: Insufficient documentation

## 2021-01-16 DIAGNOSIS — I129 Hypertensive chronic kidney disease with stage 1 through stage 4 chronic kidney disease, or unspecified chronic kidney disease: Secondary | ICD-10-CM | POA: Diagnosis not present

## 2021-01-16 DIAGNOSIS — N183 Chronic kidney disease, stage 3 unspecified: Secondary | ICD-10-CM | POA: Diagnosis not present

## 2021-01-16 DIAGNOSIS — E1169 Type 2 diabetes mellitus with other specified complication: Secondary | ICD-10-CM | POA: Diagnosis not present

## 2021-01-16 DIAGNOSIS — Z7902 Long term (current) use of antithrombotics/antiplatelets: Secondary | ICD-10-CM | POA: Insufficient documentation

## 2021-01-16 DIAGNOSIS — R531 Weakness: Secondary | ICD-10-CM | POA: Diagnosis present

## 2021-01-16 DIAGNOSIS — M79604 Pain in right leg: Secondary | ICD-10-CM | POA: Insufficient documentation

## 2021-01-16 DIAGNOSIS — J449 Chronic obstructive pulmonary disease, unspecified: Secondary | ICD-10-CM | POA: Diagnosis not present

## 2021-01-16 DIAGNOSIS — Z79899 Other long term (current) drug therapy: Secondary | ICD-10-CM | POA: Diagnosis not present

## 2021-01-16 DIAGNOSIS — M79605 Pain in left leg: Secondary | ICD-10-CM | POA: Insufficient documentation

## 2021-01-16 DIAGNOSIS — M7918 Myalgia, other site: Secondary | ICD-10-CM

## 2021-01-16 DIAGNOSIS — F1027 Alcohol dependence with alcohol-induced persisting dementia: Secondary | ICD-10-CM | POA: Diagnosis not present

## 2021-01-16 LAB — URINALYSIS, COMPLETE (UACMP) WITH MICROSCOPIC
Bacteria, UA: NONE SEEN
Bilirubin Urine: NEGATIVE
Glucose, UA: NEGATIVE mg/dL
Hgb urine dipstick: NEGATIVE
Ketones, ur: 5 mg/dL — AB
Leukocytes,Ua: NEGATIVE
Nitrite: NEGATIVE
Protein, ur: NEGATIVE mg/dL
Specific Gravity, Urine: 1.017 (ref 1.005–1.030)
pH: 6 (ref 5.0–8.0)

## 2021-01-16 LAB — BASIC METABOLIC PANEL
Anion gap: 10 (ref 5–15)
BUN: 22 mg/dL (ref 8–23)
CO2: 25 mmol/L (ref 22–32)
Calcium: 9.3 mg/dL (ref 8.9–10.3)
Chloride: 102 mmol/L (ref 98–111)
Creatinine, Ser: 1.13 mg/dL (ref 0.61–1.24)
GFR, Estimated: >60 mL/min (ref >60)
Glucose, Bld: 106 mg/dL — ABNORMAL HIGH (ref 70–99)
Potassium: 3.6 mmol/L (ref 3.5–5.1)
Sodium: 137 mmol/L (ref 135–145)

## 2021-01-16 LAB — RESP PANEL BY RT-PCR (FLU A&B, COVID) ARPGX2
Influenza A by PCR: NEGATIVE
Influenza B by PCR: NEGATIVE
SARS Coronavirus 2 by RT PCR: NEGATIVE

## 2021-01-16 LAB — CBC
HCT: 36.3 % — ABNORMAL LOW (ref 39.0–52.0)
Hemoglobin: 12.4 g/dL — ABNORMAL LOW (ref 13.0–17.0)
MCH: 32 pg (ref 26.0–34.0)
MCHC: 34.2 g/dL (ref 30.0–36.0)
MCV: 93.6 fL (ref 80.0–100.0)
Platelets: 197 10*3/uL (ref 150–400)
RBC: 3.88 MIL/uL — ABNORMAL LOW (ref 4.22–5.81)
RDW: 14.6 % (ref 11.5–15.5)
WBC: 4.9 10*3/uL (ref 4.0–10.5)
nRBC: 0 % (ref 0.0–0.2)

## 2021-01-16 LAB — TROPONIN I (HIGH SENSITIVITY): Troponin I (High Sensitivity): 22 ng/L — ABNORMAL HIGH (ref <18)
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
## 2021-01-16 NOTE — ED Notes (Signed)
Pt able to ambulate in hall with assistance. Pt noted to have steady gait and no signs of weakness at this time.

## 2021-01-16 NOTE — ED Triage Notes (Signed)
Pt arrives vis EMS from a group home- pt having bilat leg weakness and AMS- pt is A&O x2 per EMS which is not his baseline- pt has a hx of dementia- pt was combative to EMS until [employer] arrived

## 2021-01-16 NOTE — ED Notes (Signed)
Report called to staff at [facility] group home who also agree to transport pt back to facility. Pt awaiting transport at this time.

## 2021-01-16 NOTE — ED Triage Notes (Addendum)
Pt states he is having weakness in his left leg- pt states he is unable to walk on it- pt unable to tell this RN date or where he is at which is abnormal for him per EMS- pt having lower back pain

## 2021-01-24 ENCOUNTER — Emergency Department
Admission: EM | Admit: 2021-01-24 | Discharge: 2021-01-24 | Payer: Medicare Other | Attending: Emergency Medicine | Admitting: Emergency Medicine

## 2021-01-24 ENCOUNTER — Emergency Department: Payer: Medicare Other

## 2021-01-24 ENCOUNTER — Other Ambulatory Visit: Payer: Self-pay

## 2021-01-24 ENCOUNTER — Encounter: Payer: Self-pay | Admitting: Emergency Medicine

## 2021-01-24 DIAGNOSIS — M545 Low back pain, unspecified: Secondary | ICD-10-CM | POA: Diagnosis present

## 2021-01-24 DIAGNOSIS — F17200 Nicotine dependence, unspecified, uncomplicated: Secondary | ICD-10-CM | POA: Insufficient documentation

## 2021-01-24 DIAGNOSIS — G8929 Other chronic pain: Secondary | ICD-10-CM | POA: Insufficient documentation

## 2021-01-24 DIAGNOSIS — Z79899 Other long term (current) drug therapy: Secondary | ICD-10-CM | POA: Diagnosis not present

## 2021-01-24 DIAGNOSIS — M47816 Spondylosis without myelopathy or radiculopathy, lumbar region: Secondary | ICD-10-CM

## 2021-01-24 DIAGNOSIS — I129 Hypertensive chronic kidney disease with stage 1 through stage 4 chronic kidney disease, or unspecified chronic kidney disease: Secondary | ICD-10-CM | POA: Diagnosis not present

## 2021-01-24 DIAGNOSIS — M47896 Other spondylosis, lumbar region: Secondary | ICD-10-CM | POA: Diagnosis not present

## 2021-01-24 DIAGNOSIS — Z7902 Long term (current) use of antithrombotics/antiplatelets: Secondary | ICD-10-CM | POA: Diagnosis not present

## 2021-01-24 DIAGNOSIS — N1831 Chronic kidney disease, stage 3a: Secondary | ICD-10-CM | POA: Diagnosis not present

## 2021-01-24 DIAGNOSIS — Z7982 Long term (current) use of aspirin: Secondary | ICD-10-CM | POA: Insufficient documentation

## 2021-01-24 DIAGNOSIS — J449 Chronic obstructive pulmonary disease, unspecified: Secondary | ICD-10-CM | POA: Insufficient documentation

## 2021-01-24 DIAGNOSIS — E1122 Type 2 diabetes mellitus with diabetic chronic kidney disease: Secondary | ICD-10-CM | POA: Diagnosis not present

## 2021-01-24 LAB — CBC WITH DIFFERENTIAL/PLATELET
Abs Immature Granulocytes: 0.01 10*3/uL (ref 0.00–0.07)
Basophils Absolute: 0 10*3/uL (ref 0.0–0.1)
Basophils Relative: 0 %
Eosinophils Absolute: 0.1 10*3/uL (ref 0.0–0.5)
Eosinophils Relative: 2 %
HCT: 37.3 % — ABNORMAL LOW (ref 39.0–52.0)
Hemoglobin: 12.7 g/dL — ABNORMAL LOW (ref 13.0–17.0)
Immature Granulocytes: 0 %
Lymphocytes Relative: 26 %
Lymphs Abs: 1.2 10*3/uL (ref 0.7–4.0)
MCH: 31.9 pg (ref 26.0–34.0)
MCHC: 34 g/dL (ref 30.0–36.0)
MCV: 93.7 fL (ref 80.0–100.0)
Monocytes Absolute: 0.6 10*3/uL (ref 0.1–1.0)
Monocytes Relative: 14 %
Neutro Abs: 2.6 10*3/uL (ref 1.7–7.7)
Neutrophils Relative %: 58 %
Platelets: 178 10*3/uL (ref 150–400)
RBC: 3.98 MIL/uL — ABNORMAL LOW (ref 4.22–5.81)
RDW: 14.8 % (ref 11.5–15.5)
WBC: 4.5 10*3/uL (ref 4.0–10.5)
nRBC: 0 % (ref 0.0–0.2)

## 2021-01-24 LAB — URINE DRUG SCREEN, QUALITATIVE (ARMC ONLY)
Amphetamines, Ur Screen: NOT DETECTED
Barbiturates, Ur Screen: NOT DETECTED
Benzodiazepine, Ur Scrn: NOT DETECTED
Cannabinoid 50 Ng, Ur ~~LOC~~: NOT DETECTED
Cocaine Metabolite,Ur ~~LOC~~: NOT DETECTED
MDMA (Ecstasy)Ur Screen: NOT DETECTED
Methadone Scn, Ur: NOT DETECTED
Opiate, Ur Screen: NOT DETECTED
Phencyclidine (PCP) Ur S: NOT DETECTED
Tricyclic, Ur Screen: NOT DETECTED

## 2021-01-24 LAB — URINALYSIS, COMPLETE (UACMP) WITH MICROSCOPIC
Bacteria, UA: NONE SEEN
Bilirubin Urine: NEGATIVE
Glucose, UA: >=500 mg/dL — AB
Ketones, ur: NEGATIVE mg/dL
Leukocytes,Ua: NEGATIVE
Nitrite: NEGATIVE
Protein, ur: NEGATIVE mg/dL
Specific Gravity, Urine: 1.012 (ref 1.005–1.030)
Squamous Epithelial / HPF: NONE SEEN (ref 0–5)
pH: 7 (ref 5.0–8.0)

## 2021-01-24 LAB — COMPREHENSIVE METABOLIC PANEL
ALT: 13 U/L (ref 0–44)
AST: 20 U/L (ref 15–41)
Albumin: 3.5 g/dL (ref 3.5–5.0)
Alkaline Phosphatase: 72 U/L (ref 38–126)
Anion gap: 10 (ref 5–15)
BUN: 25 mg/dL — ABNORMAL HIGH (ref 8–23)
CO2: 27 mmol/L (ref 22–32)
Calcium: 9.2 mg/dL (ref 8.9–10.3)
Chloride: 101 mmol/L (ref 98–111)
Creatinine, Ser: 0.97 mg/dL (ref 0.61–1.24)
GFR, Estimated: >60 mL/min (ref >60)
Glucose, Bld: 163 mg/dL — ABNORMAL HIGH (ref 70–99)
Potassium: 3.7 mmol/L (ref 3.5–5.1)
Sodium: 138 mmol/L (ref 135–145)
Total Bilirubin: 0.7 mg/dL (ref 0.3–1.2)
Total Protein: 7.5 g/dL (ref 6.5–8.1)

## 2021-01-24 LAB — PROTIME-INR
INR: 1 (ref 0.8–1.2)
Prothrombin Time: 12.8 seconds (ref 11.4–15.2)

## 2021-01-24 MED ORDER — ACETAMINOPHEN 325 MG PO TABS
650.0000 mg | ORAL_TABLET | Freq: Once | ORAL | Status: AC
  Administered 2021-01-24: 650 mg via ORAL
  Filled 2021-01-24: qty 2

## 2021-01-24 MED ORDER — LIDOCAINE 4 % EX PTCH: 1.0000 | MEDICATED_PATCH | Freq: Every day | CUTANEOUS | 0 refills | Status: DC | PRN

## 2021-01-24 MED ORDER — LIDOCAINE 5 % EX PTCH
1.0000 | MEDICATED_PATCH | CUTANEOUS | Status: DC
  Administered 2021-01-24: 1 via TRANSDERMAL
  Filled 2021-01-24: qty 1

## 2021-01-24 NOTE — ED Notes (Signed)
Pt with unclear speech, pt states he has pain "here" and points to left lower back. Pt in NAD at this time. Pt able to transfer from wheelchair to bed by self.

## 2021-01-24 NOTE — ED Notes (Signed)
Unable to get IV access x3 attempts. IV team consult placed.

## 2021-01-24 NOTE — ED Notes (Signed)
Lab called to collect labs  

## 2021-01-24 NOTE — ED Triage Notes (Signed)
Pt to ED via [facility] from nursing facility. Pt states that he is having back in the left lower back area. Pt denies any other symptoms. Pt is in NAD.
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
## 2021-01-24 NOTE — ED Notes (Signed)
Nate from group home present to pick up pt.

## 2021-01-24 NOTE — Progress Notes (Signed)
A consult was placed to the IV Therapist for iv access;  Elnita Maxwell, RN,  House supervisor made aware at [phone], and will have next shift attempt to place an iv.

## 2021-01-24 NOTE — ED Notes (Signed)
This RN attempted x2 to call group home, unable to leave message, no answer

## 2021-01-24 NOTE — ED Notes (Signed)
Pt wet, brief soaked, pt and bed changed. Pt unable to urinate in urinal.

## 2021-01-24 NOTE — ED Notes (Signed)
Pt used call bell to call for nurse to use the bathroom, pt unable to urinate in urinal. Pt straight cathd for urine.

## 2021-01-24 NOTE — ED Notes (Signed)
Called and spoke with group home member who reports she will be coming to pick up pt now.

## 2021-01-24 NOTE — ED Notes (Signed)
This RN attempted to call pt's group home [facility], no answer

## 2021-01-24 NOTE — Discharge Instructions (Addendum)
Please use Tylenol, up to 650 mg 4x daily for back pain. You may also use the lidocaine patches, one daily as prescribed. Follow up with primary care as needed. Return to ER for any worsening.

## 2021-01-24 NOTE — ED Notes (Signed)
Pt a resident at [facility]. [phone]. Per caregiver, pt is urinating on himself more frequently and is sometimes unable to stand. Pt with c/o increased back pain in the past few days. EDP notified.

## 2021-01-26 ENCOUNTER — Emergency Department
Admission: EM | Admit: 2021-01-26 | Discharge: 2021-01-27 | Disposition: A | Discharge status: Home / Self Care | Payer: Medicare Other | Attending: Emergency Medicine | Admitting: Emergency Medicine

## 2021-01-26 ENCOUNTER — Emergency Department: Payer: Medicare Other

## 2021-01-26 ENCOUNTER — Other Ambulatory Visit: Payer: Self-pay

## 2021-01-26 ENCOUNTER — Encounter: Payer: Self-pay | Admitting: Emergency Medicine

## 2021-01-26 DIAGNOSIS — Z7951 Long term (current) use of inhaled steroids: Secondary | ICD-10-CM | POA: Insufficient documentation

## 2021-01-26 DIAGNOSIS — Z7982 Long term (current) use of aspirin: Secondary | ICD-10-CM | POA: Insufficient documentation

## 2021-01-26 DIAGNOSIS — R0602 Shortness of breath: Secondary | ICD-10-CM | POA: Diagnosis not present

## 2021-01-26 DIAGNOSIS — M545 Low back pain, unspecified: Secondary | ICD-10-CM | POA: Diagnosis not present

## 2021-01-26 DIAGNOSIS — F17200 Nicotine dependence, unspecified, uncomplicated: Secondary | ICD-10-CM | POA: Diagnosis not present

## 2021-01-26 DIAGNOSIS — N1831 Chronic kidney disease, stage 3a: Secondary | ICD-10-CM | POA: Diagnosis not present

## 2021-01-26 DIAGNOSIS — Z79899 Other long term (current) drug therapy: Secondary | ICD-10-CM | POA: Diagnosis not present

## 2021-01-26 DIAGNOSIS — Z7902 Long term (current) use of antithrombotics/antiplatelets: Secondary | ICD-10-CM | POA: Diagnosis not present

## 2021-01-26 DIAGNOSIS — R531 Weakness: Secondary | ICD-10-CM | POA: Insufficient documentation

## 2021-01-26 DIAGNOSIS — M25552 Pain in left hip: Secondary | ICD-10-CM | POA: Insufficient documentation

## 2021-01-26 DIAGNOSIS — Z20822 Contact with and (suspected) exposure to covid-19: Secondary | ICD-10-CM | POA: Insufficient documentation

## 2021-01-26 DIAGNOSIS — J449 Chronic obstructive pulmonary disease, unspecified: Secondary | ICD-10-CM | POA: Insufficient documentation

## 2021-01-26 DIAGNOSIS — I129 Hypertensive chronic kidney disease with stage 1 through stage 4 chronic kidney disease, or unspecified chronic kidney disease: Secondary | ICD-10-CM | POA: Diagnosis not present

## 2021-01-26 DIAGNOSIS — E1122 Type 2 diabetes mellitus with diabetic chronic kidney disease: Secondary | ICD-10-CM | POA: Diagnosis not present

## 2021-01-26 LAB — BASIC METABOLIC PANEL
Anion gap: 14 (ref 5–15)
BUN: 23 mg/dL (ref 8–23)
CO2: 22 mmol/L (ref 22–32)
Calcium: 9.4 mg/dL (ref 8.9–10.3)
Chloride: 102 mmol/L (ref 98–111)
Creatinine, Ser: 1.15 mg/dL (ref 0.61–1.24)
GFR, Estimated: >60 mL/min (ref >60)
Glucose, Bld: 115 mg/dL — ABNORMAL HIGH (ref 70–99)
Potassium: 3.2 mmol/L — ABNORMAL LOW (ref 3.5–5.1)
Sodium: 138 mmol/L (ref 135–145)

## 2021-01-26 LAB — CBC
HCT: 35.6 % — ABNORMAL LOW (ref 39.0–52.0)
Hemoglobin: 11.9 g/dL — ABNORMAL LOW (ref 13.0–17.0)
MCH: 31.2 pg (ref 26.0–34.0)
MCHC: 33.4 g/dL (ref 30.0–36.0)
MCV: 93.4 fL (ref 80.0–100.0)
Platelets: 180 10*3/uL (ref 150–400)
RBC: 3.81 MIL/uL — ABNORMAL LOW (ref 4.22–5.81)
RDW: 14.9 % (ref 11.5–15.5)
WBC: 4.4 10*3/uL (ref 4.0–10.5)
nRBC: 0 % (ref 0.0–0.2)

## 2021-01-26 LAB — URINALYSIS, COMPLETE (UACMP) WITH MICROSCOPIC
Bacteria, UA: NONE SEEN
Bilirubin Urine: NEGATIVE
Glucose, UA: 50 mg/dL — AB
Hgb urine dipstick: NEGATIVE
Ketones, ur: NEGATIVE mg/dL
Leukocytes,Ua: NEGATIVE
Nitrite: NEGATIVE
Protein, ur: NEGATIVE mg/dL
Specific Gravity, Urine: 1.009 (ref 1.005–1.030)
Squamous Epithelial / HPF: NONE SEEN (ref 0–5)
pH: 7 (ref 5.0–8.0)

## 2021-01-26 LAB — TROPONIN I (HIGH SENSITIVITY)
Troponin I (High Sensitivity): 49 ng/L — ABNORMAL HIGH (ref <18)
Troponin I (High Sensitivity): 38 ng/L — ABNORMAL HIGH (ref <18)

## 2021-01-26 LAB — MAGNESIUM: Magnesium: 1.7 mg/dL (ref 1.7–2.4)

## 2021-01-26 LAB — CK: Total CK: 51 U/L (ref 49–397)

## 2021-01-26 LAB — RESP PANEL BY RT-PCR (FLU A&B, COVID) ARPGX2
Influenza A by PCR: NEGATIVE
Influenza B by PCR: NEGATIVE
SARS Coronavirus 2 by RT PCR: NEGATIVE

## 2021-01-26 LAB — PROCALCITONIN: Procalcitonin: <0.1 ng/mL

## 2021-01-26 LAB — BRAIN NATRIURETIC PEPTIDE: B Natriuretic Peptide: 29.1 pg/mL (ref 0.0–100.0)

## 2021-01-26 MED ORDER — AMLODIPINE BESYLATE 5 MG PO TABS
5.0000 mg | ORAL_TABLET | Freq: Every day | ORAL | Status: DC
  Administered 2021-01-27: 5 mg via ORAL
  Filled 2021-01-26: qty 1

## 2021-01-26 MED ORDER — IPRATROPIUM-ALBUTEROL 0.5-2.5 (3) MG/3ML IN SOLN
3.0000 mL | Freq: Two times a day (BID) | RESPIRATORY_TRACT | Status: DC
  Administered 2021-01-27: 3 mL via RESPIRATORY_TRACT
  Filled 2021-01-26: qty 3

## 2021-01-26 MED ORDER — ASPIRIN EC 81 MG PO TBEC
81.0000 mg | DELAYED_RELEASE_TABLET | Freq: Every day | ORAL | Status: DC
  Administered 2021-01-27: 81 mg via ORAL
  Filled 2021-01-26: qty 1

## 2021-01-26 MED ORDER — TRAZODONE HCL 50 MG PO TABS
50.0000 mg | ORAL_TABLET | Freq: Every day | ORAL | Status: DC
  Administered 2021-01-26: 50 mg via ORAL
  Filled 2021-01-26: qty 1

## 2021-01-26 MED ORDER — CARVEDILOL 6.25 MG PO TABS
12.5000 mg | ORAL_TABLET | Freq: Two times a day (BID) | ORAL | Status: DC
  Administered 2021-01-27: 12.5 mg via ORAL
  Filled 2021-01-26: qty 2

## 2021-01-26 MED ORDER — ATORVASTATIN CALCIUM 20 MG PO TABS
40.0000 mg | ORAL_TABLET | Freq: Every day | ORAL | Status: DC
  Administered 2021-01-26: 40 mg via ORAL
  Filled 2021-01-26: qty 2

## 2021-01-26 MED ORDER — CLOPIDOGREL BISULFATE 75 MG PO TABS
75.0000 mg | ORAL_TABLET | Freq: Every day | ORAL | Status: DC
  Administered 2021-01-27: 75 mg via ORAL
  Filled 2021-01-26: qty 1

## 2021-01-26 MED ORDER — RISAQUAD PO CAPS
1.0000 | ORAL_CAPSULE | Freq: Every day | ORAL | Status: DC
  Administered 2021-01-27: 1 via ORAL
  Filled 2021-01-26: qty 1

## 2021-01-26 MED ORDER — LIDOCAINE 5 % EX PTCH
1.0000 | MEDICATED_PATCH | CUTANEOUS | Status: DC
  Administered 2021-01-26: 1 via TRANSDERMAL
  Filled 2021-01-26 (×2): qty 1

## 2021-01-26 MED ORDER — BENZTROPINE MESYLATE 1 MG PO TABS
0.5000 mg | ORAL_TABLET | Freq: Two times a day (BID) | ORAL | Status: DC
  Administered 2021-01-26 – 2021-01-27 (×2): 0.5 mg via ORAL
  Filled 2021-01-26 (×2): qty 1

## 2021-01-26 MED ORDER — PALIPERIDONE ER 3 MG PO TB24
9.0000 mg | ORAL_TABLET | Freq: Every day | ORAL | Status: DC
  Filled 2021-01-26: qty 3

## 2021-01-26 MED ORDER — ACETAMINOPHEN 500 MG PO TABS
1000.0000 mg | ORAL_TABLET | Freq: Once | ORAL | Status: AC
  Administered 2021-01-26: 1000 mg via ORAL
  Filled 2021-01-26: qty 2

## 2021-01-26 MED ORDER — DONEPEZIL HCL 5 MG PO TABS
10.0000 mg | ORAL_TABLET | Freq: Every day | ORAL | Status: DC
  Administered 2021-01-27: 10 mg via ORAL
  Filled 2021-01-26: qty 2

## 2021-01-26 MED ORDER — LIDOCAINE 5 % EX PTCH
1.0000 | MEDICATED_PATCH | Freq: Every day | CUTANEOUS | Status: DC | PRN
  Administered 2021-01-27: 1 via TRANSDERMAL

## 2021-01-26 MED ORDER — OXYCODONE HCL 5 MG PO TABS
2.5000 mg | ORAL_TABLET | Freq: Once | ORAL | Status: AC
  Administered 2021-01-26: 2.5 mg via ORAL
  Filled 2021-01-26: qty 1

## 2021-01-26 MED ORDER — NICOTINE 21 MG/24HR TD PT24
21.0000 mg | MEDICATED_PATCH | Freq: Every day | TRANSDERMAL | Status: DC
  Administered 2021-01-27: 21 mg via TRANSDERMAL
  Filled 2021-01-26: qty 1

## 2021-01-26 MED ORDER — POTASSIUM CHLORIDE CRYS ER 20 MEQ PO TBCR
40.0000 meq | EXTENDED_RELEASE_TABLET | Freq: Once | ORAL | Status: AC
  Administered 2021-01-26: 40 meq via ORAL
  Filled 2021-01-26: qty 2

## 2021-01-26 MED ORDER — TRIHEXYPHENIDYL HCL 5 MG PO TABS
5.0000 mg | ORAL_TABLET | Freq: Two times a day (BID) | ORAL | Status: DC
  Administered 2021-01-27: 5 mg via ORAL
  Filled 2021-01-26 (×3): qty 1

## 2021-01-26 NOTE — ED Notes (Signed)
Pt taken to MRI  

## 2021-01-26 NOTE — ED Notes (Signed)
Unsuccessful IV attempt x2, once in right antecubital and once in right forearm. Gauze dressing applied to both sites. Called lab to obtain repeat troponin.

## 2021-01-26 NOTE — Progress Notes (Signed)
PT Cancellation Note  Patient Details Name: Terrence Johnson MRN: [id number] DOB: 12-Dec-1940   Cancelled Treatment:    Reason Eval/Treat Not Completed: Other (comment): LLE MRI pending to rule out fractures. Will attempt to see pt at a future date/time as medically appropriate.     Ovidio Hanger PT, DPT 01/26/21, 4:12 PM

## 2021-01-26 NOTE — ED Notes (Signed)
Called MRI to check on when pt was going to have MRI performed. Per Terrence Johnson in MRI it will be a couple hours. PT informed.

## 2021-01-26 NOTE — ED Notes (Signed)
Pt able to swing legs off of side of bed and stand independently using walker, reports being unable to take steps due to "hurting everywhere", continuously does a shuffling motion with his feet. Notified Dr Fuller Plan who will order pain meds and we will attempt to walk again.

## 2021-01-26 NOTE — ED Notes (Signed)
Changed pt bed sheets; put a male purewick on him.

## 2021-01-26 NOTE — ED Notes (Signed)
Pt given meal tray at this time. Pt's tray set up for him. Pt pulled up in bed at this time. Pt eating and denies any other needs.

## 2021-01-26 NOTE — ED Notes (Signed)
Lab at bedside to obtain repeat troponin

## 2021-01-26 NOTE — ED Notes (Signed)
Pt back from MRI 

## 2021-01-26 NOTE — ED Triage Notes (Signed)
Arrives via [facility] with c/o weakness, can't walk and hurts all over. Seen through ED for same over past week.  Lives at [facility], group home.  Phone number is [phone].  [facility]   Per report, patient walks with walker.

## 2021-01-26 NOTE — ED Notes (Signed)
Pt given dinner tray and eating at this time 
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
## 2021-01-27 DIAGNOSIS — M25552 Pain in left hip: Secondary | ICD-10-CM | POA: Diagnosis not present

## 2021-01-27 NOTE — ED Notes (Signed)
Facility where pt resides is waiting in lobby to pick him up.

## 2021-01-27 NOTE — TOC Transition Note (Signed)
Transition of Care [facility]) - CM/SW Discharge Note   Patient Details  Name: Terrence Johnson MRN: [id number] Date of Birth: 1940/11/15  [facility] CM/SW Contact:  Marina Goodell Phone Number:  [phone] 01/27/2021, 2:53 PM   Clinical Narrative:     Patient will d/c back to [facility] with home health with PT.  CSW contacted [facility].  Minerva Areola [phone], from group home stated he will pick up the patient.  EDP/ED Staff updated on discharge plan.        Patient Goals and CMS Choice        Discharge Placement                       Discharge Plan and Services                                     Social Determinants of Health (SDOH) Interventions     Readmission Risk Interventions No flowsheet data found.

## 2021-01-27 NOTE — Evaluation (Signed)
Physical Therapy Evaluation Patient Details Name: Terrence Johnson MRN: [id number] DOB: 12-12-1940 Today's Date: 01/27/2021   History of Present Illness  Pt is a 80 y.o. male with cognitive impairment from group home who comes in for weakness.  Pt reported severe pain in his left hip, SI joint area with ambulation with MRI negative.  PMH includes: cognitive impairment, COPD, DM, HTN, schizophrenia, CKD, NSTEMI, and schizophrenia.    Clinical Impression  Pt was pleasant but somewhat anxious regarding mobility secondary to concerns with back pain and LLE pain with weight bearing.  With gentle encouragement pt willing to participate and ended up putting forth very good effort during the session.  The pt was very antalgic on the LLE initially with WB activity/ambulation but as gait distance progressed the quality of his gait improved significantly.  Pt's first 20 feet of walking was with a step-to pattern with significantly reduced LLE stance time but progressed gradually to where he was walking with step-through pattern with equal stance time to his BLEs with good cadence.  Pt reported no adverse symptoms during the session other than LLE pain with HR and SpO2 WNL on room air.  Pt will benefit from HHPT upon discharge to safely address deficits listed in patient problem list for decreased caregiver assistance and eventual return to PLOF.     Follow Up Recommendations Home health PT;Supervision/Assistance - 24 hour    Equipment Recommendations  None recommended by PT    Recommendations for Other Services       Precautions / Restrictions Precautions Precautions: Fall Restrictions Weight Bearing Restrictions: No      Mobility  Bed Mobility Overal bed mobility: Modified Independent             General bed mobility comments: Min extra time and effort but no physical assist or use of bed rail needed    Transfers Overall transfer level: Needs assistance Equipment used: Rolling walker (2  wheeled) Transfers: Sit to/from Stand Sit to Stand: Supervision         General transfer comment: Good eccentric and concentric control and stability  Ambulation/Gait Ambulation/Gait assistance: Supervision Gait Distance (Feet): 150 Feet Assistive device: Rolling walker (2 wheeled) Gait Pattern/deviations: Step-to pattern;Step-through pattern;Antalgic;Decreased step length - left Gait velocity: decreased   General Gait Details: Slow antalgic gait with step-to pattern initially but progressed gradually to step-through pattern with good cadence; good stability with no LOB or buckling  Stairs            Wheelchair Mobility    Modified Rankin (Stroke Patients Only)       Balance Overall balance assessment: Needs assistance   Sitting balance-Leahy Scale: Normal     Standing balance support: Bilateral upper extremity supported;During functional activity Standing balance-Leahy Scale: Good                               Pertinent Vitals/Pain Pain Assessment: 0-10 Pain Score: 6  Pain Location: Back Pain Descriptors / Indicators: Sore Pain Intervention(s): Premedicated before session;Monitored during session;Repositioned    Home Living Family/patient expects to be discharged to:: Group home                      Prior Function Level of Independence: Needs assistance   Gait / Transfers Assistance Needed: Ind amb facility distances with a RW, no fall history  ADL's / Homemaking Assistance Needed: Staff at group home assist with meals, meds, and  ADL set up        Hand Dominance        Extremity/Trunk Assessment   Upper Extremity Assessment Upper Extremity Assessment: Generalized weakness    Lower Extremity Assessment Lower Extremity Assessment: Generalized weakness       Communication   Communication: Other (comment) (Limited verbal communication with mostly short answers to questions only; little to no initiation of verbal  communication)  Cognition Arousal/Alertness: Awake/alert Behavior During Therapy: Flat affect Overall Cognitive Status: No family/caregiver present to determine baseline cognitive functioning                                 General Comments: Pt able to provided basic history and follow 1-step commands consistently      General Comments      Exercises Total Joint Exercises Ankle Circles/Pumps: AROM;Strengthening;Both;10 reps Quad Sets: 10 reps;Both;Strengthening;AROM Heel Slides: Strengthening;Both;10 reps Straight Leg Raises: Strengthening;10 reps;Both Burget Arc Quad: Strengthening;Both;10 reps Knee Flexion: 10 reps;Both;Strengthening   Assessment/Plan    PT Assessment Patient needs continued PT services  PT Problem List Decreased strength;Decreased activity tolerance;Decreased balance;Decreased mobility;Decreased knowledge of use of DME;Pain       PT Treatment Interventions DME instruction;Gait training;Functional mobility training;Therapeutic activities;Therapeutic exercise;Patient/family education;Stair training    PT Goals (Current goals can be found in the Care Plan section)  Acute Rehab PT Goals Patient Stated Goal: Decreased LLE pain with walking PT Goal Formulation: With patient Time For Goal Achievement: 02/09/21 Potential to Achieve Goals: Fair    Frequency Min 2X/week   Barriers to discharge        Co-evaluation               AM-PAC PT "6 Clicks" Mobility  Outcome Measure Help needed turning from your back to your side while in a flat bed without using bedrails?: None Help needed moving from lying on your back to sitting on the side of a flat bed without using bedrails?: None Help needed moving to and from a bed to a chair (including a wheelchair)?: A Little Help needed standing up from a chair using your arms (e.g., wheelchair or bedside chair)?: A Little Help needed to walk in hospital room?: A Little Help needed climbing 3-5 steps  with a railing? : A Little 6 Click Score: 20    End of Session Equipment Utilized During Treatment: Gait belt Activity Tolerance: Patient tolerated treatment well Patient left: in bed;with call bell/phone within reach;Other (comment) (Both side rails up as found in ED) Nurse Communication: Mobility status PT Visit Diagnosis: Difficulty in walking, not elsewhere classified (R26.2);Muscle weakness (generalized) (M62.81);Pain Pain - Right/Left: Left Pain - part of body: Leg    Time: 3748-2707 PT Time Calculation (min) (ACUTE ONLY): 23 min   Charges:   PT Evaluation $PT Eval Moderate Complexity: 1 Mod PT Treatments $Therapeutic Exercise: 8-22 mins        D. Scott Natallia Stellmach PT, DPT 01/27/21, 11:23 AM

## 2021-01-27 NOTE — ED Notes (Signed)
Breakfast tray given. No other needs found at this moment.  PT at bedside.

## 2021-01-27 NOTE — ED Notes (Signed)
Lunch tray provided to pt.   Helped pt reposition in bed to sit higher in bed.

## 2021-01-27 NOTE — ED Notes (Signed)
Pt walked successfully with PT. Now will administer morning meds. Pt hasa breakfast tray at bedside.

## 2021-03-25 ENCOUNTER — Encounter: Payer: Self-pay | Admitting: Podiatry

## 2021-03-25 ENCOUNTER — Ambulatory Visit (INDEPENDENT_AMBULATORY_CARE_PROVIDER_SITE_OTHER): Payer: Medicare Other | Admitting: Podiatry

## 2021-03-25 ENCOUNTER — Other Ambulatory Visit: Payer: Self-pay

## 2021-03-25 DIAGNOSIS — E1159 Type 2 diabetes mellitus with other circulatory complications: Secondary | ICD-10-CM | POA: Diagnosis not present

## 2021-03-25 DIAGNOSIS — N183 Chronic kidney disease, stage 3 unspecified: Secondary | ICD-10-CM

## 2021-03-25 DIAGNOSIS — B351 Tinea unguium: Secondary | ICD-10-CM | POA: Diagnosis not present

## 2021-03-25 NOTE — Progress Notes (Signed)
This patient returns to my office for at risk foot care.  This patient requires this care by a professional since this patient will be at risk due to having diabetes and coagulation defect.  Patient is taking plavix.  This patient is unable to cut nails himself since the patient cannot reach his nails.These nails are painful walking and wearing shoes.  Patient is brought by his caregiver. This patient presents for at risk foot care today.  General Appearance  Alert, conversant and in no acute stress.  Vascular  Dorsalis pedis and posterior tibial  pulses are weakly/not  palpable  bilaterally.  Capillary return is within normal limits  Bilaterally.  Cold feet  B/L. bilaterally.  Neurologic  deferred  Nails Thick disfigured discolored nails with subungual debris  from hallux to fifth toes bilaterally. No evidence of bacterial infection or drainage bilaterally.  Orthopedic  No limitations of motion  feet .  No crepitus or effusions noted.  No bony pathology or digital deformities noted.  Skin  normotropic skin with no porokeratosis noted bilaterally.  No signs of infections or ulcers noted.     Onychomycosis  Pain in right toes  Pain in left toes  Consent was obtained for treatment procedures.   Mechanical debridement of nails 1-5  bilaterally performed with a nail nipper.  Filed with dremel without incident.    Return office visit    4 months                 Told patient to return for periodic foot care and evaluation due to potential at risk complications.   Helane Gunther DPM

## 2021-05-23 ENCOUNTER — Emergency Department: Payer: Medicare Other

## 2021-05-23 ENCOUNTER — Inpatient Hospital Stay
Admission: EM | Admit: 2021-05-23 | Discharge: 2021-06-11 | DRG: 871 | Disposition: A | Discharge status: Skilled Nursing Facility | Payer: Medicare Other | Source: Skilled Nursing Facility | Attending: Internal Medicine | Admitting: Internal Medicine

## 2021-05-23 ENCOUNTER — Other Ambulatory Visit: Payer: Self-pay

## 2021-05-23 DIAGNOSIS — E7800 Pure hypercholesterolemia, unspecified: Secondary | ICD-10-CM | POA: Diagnosis present

## 2021-05-23 DIAGNOSIS — Z7982 Long term (current) use of aspirin: Secondary | ICD-10-CM

## 2021-05-23 DIAGNOSIS — Z7189 Other specified counseling: Secondary | ICD-10-CM | POA: Diagnosis not present

## 2021-05-23 DIAGNOSIS — K5732 Diverticulitis of large intestine without perforation or abscess without bleeding: Secondary | ICD-10-CM | POA: Diagnosis present

## 2021-05-23 DIAGNOSIS — B954 Other streptococcus as the cause of diseases classified elsewhere: Secondary | ICD-10-CM | POA: Diagnosis present

## 2021-05-23 DIAGNOSIS — R570 Cardiogenic shock: Secondary | ICD-10-CM | POA: Diagnosis present

## 2021-05-23 DIAGNOSIS — Z515 Encounter for palliative care: Secondary | ICD-10-CM | POA: Diagnosis not present

## 2021-05-23 DIAGNOSIS — T68XXXA Hypothermia, initial encounter: Secondary | ICD-10-CM

## 2021-05-23 DIAGNOSIS — Z66 Do not resuscitate: Secondary | ICD-10-CM | POA: Diagnosis present

## 2021-05-23 DIAGNOSIS — K5792 Diverticulitis of intestine, part unspecified, without perforation or abscess without bleeding: Secondary | ICD-10-CM

## 2021-05-23 DIAGNOSIS — N179 Acute kidney failure, unspecified: Secondary | ICD-10-CM | POA: Diagnosis present

## 2021-05-23 DIAGNOSIS — I5032 Chronic diastolic (congestive) heart failure: Secondary | ICD-10-CM | POA: Diagnosis present

## 2021-05-23 DIAGNOSIS — Z79899 Other long term (current) drug therapy: Secondary | ICD-10-CM

## 2021-05-23 DIAGNOSIS — B952 Enterococcus as the cause of diseases classified elsewhere: Secondary | ICD-10-CM | POA: Diagnosis present

## 2021-05-23 DIAGNOSIS — D72818 Other decreased white blood cell count: Secondary | ICD-10-CM | POA: Diagnosis present

## 2021-05-23 DIAGNOSIS — A4189 Other specified sepsis: Principal | ICD-10-CM | POA: Diagnosis present

## 2021-05-23 DIAGNOSIS — Z4659 Encounter for fitting and adjustment of other gastrointestinal appliance and device: Secondary | ICD-10-CM

## 2021-05-23 DIAGNOSIS — F1027 Alcohol dependence with alcohol-induced persisting dementia: Secondary | ICD-10-CM | POA: Diagnosis present

## 2021-05-23 DIAGNOSIS — E876 Hypokalemia: Secondary | ICD-10-CM | POA: Diagnosis present

## 2021-05-23 DIAGNOSIS — I491 Atrial premature depolarization: Secondary | ICD-10-CM | POA: Diagnosis not present

## 2021-05-23 DIAGNOSIS — U071 COVID-19: Secondary | ICD-10-CM

## 2021-05-23 DIAGNOSIS — N182 Chronic kidney disease, stage 2 (mild): Secondary | ICD-10-CM | POA: Diagnosis present

## 2021-05-23 DIAGNOSIS — I213 ST elevation (STEMI) myocardial infarction of unspecified site: Secondary | ICD-10-CM | POA: Diagnosis not present

## 2021-05-23 DIAGNOSIS — F209 Schizophrenia, unspecified: Secondary | ICD-10-CM | POA: Diagnosis present

## 2021-05-23 DIAGNOSIS — E1122 Type 2 diabetes mellitus with diabetic chronic kidney disease: Secondary | ICD-10-CM | POA: Diagnosis present

## 2021-05-23 DIAGNOSIS — I130 Hypertensive heart and chronic kidney disease with heart failure and stage 1 through stage 4 chronic kidney disease, or unspecified chronic kidney disease: Secondary | ICD-10-CM | POA: Diagnosis present

## 2021-05-23 DIAGNOSIS — R410 Disorientation, unspecified: Secondary | ICD-10-CM

## 2021-05-23 DIAGNOSIS — K5900 Constipation, unspecified: Secondary | ICD-10-CM | POA: Diagnosis present

## 2021-05-23 DIAGNOSIS — R1311 Dysphagia, oral phase: Secondary | ICD-10-CM | POA: Diagnosis present

## 2021-05-23 DIAGNOSIS — I10 Essential (primary) hypertension: Secondary | ICD-10-CM | POA: Diagnosis present

## 2021-05-23 DIAGNOSIS — I248 Other forms of acute ischemic heart disease: Secondary | ICD-10-CM | POA: Diagnosis present

## 2021-05-23 DIAGNOSIS — R0602 Shortness of breath: Secondary | ICD-10-CM | POA: Diagnosis not present

## 2021-05-23 DIAGNOSIS — R4182 Altered mental status, unspecified: Secondary | ICD-10-CM

## 2021-05-23 DIAGNOSIS — R296 Repeated falls: Secondary | ICD-10-CM | POA: Diagnosis present

## 2021-05-23 DIAGNOSIS — R1011 Right upper quadrant pain: Secondary | ICD-10-CM

## 2021-05-23 DIAGNOSIS — R5381 Other malaise: Secondary | ICD-10-CM | POA: Diagnosis present

## 2021-05-23 DIAGNOSIS — Z7902 Long term (current) use of antithrombotics/antiplatelets: Secondary | ICD-10-CM

## 2021-05-23 DIAGNOSIS — N390 Urinary tract infection, site not specified: Secondary | ICD-10-CM

## 2021-05-23 DIAGNOSIS — N183 Chronic kidney disease, stage 3 unspecified: Secondary | ICD-10-CM | POA: Diagnosis present

## 2021-05-23 DIAGNOSIS — D6959 Other secondary thrombocytopenia: Secondary | ICD-10-CM | POA: Diagnosis present

## 2021-05-23 DIAGNOSIS — I440 Atrioventricular block, first degree: Secondary | ICD-10-CM | POA: Diagnosis present

## 2021-05-23 DIAGNOSIS — E1169 Type 2 diabetes mellitus with other specified complication: Secondary | ICD-10-CM | POA: Diagnosis present

## 2021-05-23 DIAGNOSIS — E785 Hyperlipidemia, unspecified: Secondary | ICD-10-CM | POA: Diagnosis present

## 2021-05-23 DIAGNOSIS — F17200 Nicotine dependence, unspecified, uncomplicated: Secondary | ICD-10-CM | POA: Diagnosis present

## 2021-05-23 DIAGNOSIS — J449 Chronic obstructive pulmonary disease, unspecified: Secondary | ICD-10-CM | POA: Diagnosis present

## 2021-05-23 DIAGNOSIS — R001 Bradycardia, unspecified: Secondary | ICD-10-CM | POA: Diagnosis not present

## 2021-05-23 DIAGNOSIS — Z8249 Family history of ischemic heart disease and other diseases of the circulatory system: Secondary | ICD-10-CM

## 2021-05-23 DIAGNOSIS — I4581 Long QT syndrome: Secondary | ICD-10-CM | POA: Diagnosis not present

## 2021-05-23 DIAGNOSIS — N3000 Acute cystitis without hematuria: Secondary | ICD-10-CM | POA: Diagnosis present

## 2021-05-23 DIAGNOSIS — F442 Dissociative stupor: Secondary | ICD-10-CM

## 2021-05-23 LAB — COMPREHENSIVE METABOLIC PANEL
ALT: 67 U/L — ABNORMAL HIGH (ref 0–44)
ALT: 54 U/L — ABNORMAL HIGH (ref 0–44)
AST: 55 U/L — ABNORMAL HIGH (ref 15–41)
AST: 41 U/L (ref 15–41)
Albumin: 3.1 g/dL — ABNORMAL LOW (ref 3.5–5.0)
Albumin: 2.4 g/dL — ABNORMAL LOW (ref 3.5–5.0)
Alkaline Phosphatase: 89 U/L (ref 38–126)
Alkaline Phosphatase: 64 U/L (ref 38–126)
Anion gap: 10 (ref 5–15)
Anion gap: 7 (ref 5–15)
BUN: 31 mg/dL — ABNORMAL HIGH (ref 8–23)
BUN: 30 mg/dL — ABNORMAL HIGH (ref 8–23)
CO2: 31 mmol/L (ref 22–32)
CO2: 26 mmol/L (ref 22–32)
Calcium: 10.2 mg/dL (ref 8.9–10.3)
Calcium: 8.3 mg/dL — ABNORMAL LOW (ref 8.9–10.3)
Chloride: 100 mmol/L (ref 98–111)
Chloride: 109 mmol/L (ref 98–111)
Creatinine, Ser: 1.03 mg/dL (ref 0.61–1.24)
Creatinine, Ser: 1.01 mg/dL (ref 0.61–1.24)
GFR, Estimated: >60 mL/min (ref >60)
GFR, Estimated: >60 mL/min (ref >60)
Glucose, Bld: 208 mg/dL — ABNORMAL HIGH (ref 70–99)
Glucose, Bld: 91 mg/dL (ref 70–99)
Potassium: 3.5 mmol/L (ref 3.5–5.1)
Potassium: 3.5 mmol/L (ref 3.5–5.1)
Sodium: 141 mmol/L (ref 135–145)
Sodium: 142 mmol/L (ref 135–145)
Total Bilirubin: 0.8 mg/dL (ref 0.3–1.2)
Total Bilirubin: 0.7 mg/dL (ref 0.3–1.2)
Total Protein: 6.6 g/dL (ref 6.5–8.1)
Total Protein: 4.9 g/dL — ABNORMAL LOW (ref 6.5–8.1)

## 2021-05-23 LAB — CBC WITH DIFFERENTIAL/PLATELET
Abs Immature Granulocytes: 0.01 10*3/uL (ref 0.00–0.07)
Abs Immature Granulocytes: 0.01 10*3/uL (ref 0.00–0.07)
Basophils Absolute: 0 10*3/uL (ref 0.0–0.1)
Basophils Absolute: 0 10*3/uL (ref 0.0–0.1)
Basophils Relative: 0 %
Basophils Relative: 0 %
Eosinophils Absolute: 0 10*3/uL (ref 0.0–0.5)
Eosinophils Absolute: 0 10*3/uL (ref 0.0–0.5)
Eosinophils Relative: 0 %
Eosinophils Relative: 0 %
HCT: 32.5 % — ABNORMAL LOW (ref 39.0–52.0)
HCT: 25.9 % — ABNORMAL LOW (ref 39.0–52.0)
Hemoglobin: 11 g/dL — ABNORMAL LOW (ref 13.0–17.0)
Hemoglobin: 8.5 g/dL — ABNORMAL LOW (ref 13.0–17.0)
Immature Granulocytes: 0 %
Immature Granulocytes: 0 %
Lymphocytes Relative: 8 %
Lymphocytes Relative: 5 %
Lymphs Abs: 0.3 10*3/uL — ABNORMAL LOW (ref 0.7–4.0)
Lymphs Abs: 0.2 10*3/uL — ABNORMAL LOW (ref 0.7–4.0)
MCH: 31.9 pg (ref 26.0–34.0)
MCH: 31 pg (ref 26.0–34.0)
MCHC: 33.8 g/dL (ref 30.0–36.0)
MCHC: 32.8 g/dL (ref 30.0–36.0)
MCV: 94.2 fL (ref 80.0–100.0)
MCV: 94.5 fL (ref 80.0–100.0)
Monocytes Absolute: 0.4 10*3/uL (ref 0.1–1.0)
Monocytes Absolute: 0.7 10*3/uL (ref 0.1–1.0)
Monocytes Relative: 11 %
Monocytes Relative: 14 %
Neutro Abs: 3.2 10*3/uL (ref 1.7–7.7)
Neutro Abs: 3.7 10*3/uL (ref 1.7–7.7)
Neutrophils Relative %: 81 %
Neutrophils Relative %: 81 %
Platelets: 84 10*3/uL — ABNORMAL LOW (ref 150–400)
Platelets: 79 10*3/uL — ABNORMAL LOW (ref 150–400)
RBC: 3.45 MIL/uL — ABNORMAL LOW (ref 4.22–5.81)
RBC: 2.74 MIL/uL — ABNORMAL LOW (ref 4.22–5.81)
RDW: 15.6 % — ABNORMAL HIGH (ref 11.5–15.5)
RDW: 15.9 % — ABNORMAL HIGH (ref 11.5–15.5)
WBC: 3.9 10*3/uL — ABNORMAL LOW (ref 4.0–10.5)
WBC: 4.6 10*3/uL (ref 4.0–10.5)
nRBC: 0 % (ref 0.0–0.2)
nRBC: 0.7 % — ABNORMAL HIGH (ref 0.0–0.2)

## 2021-05-23 LAB — LACTIC ACID, PLASMA
Lactic Acid, Venous: 1.6 mmol/L (ref 0.5–1.9)
Lactic Acid, Venous: 1.1 mmol/L (ref 0.5–1.9)

## 2021-05-23 LAB — URINALYSIS, COMPLETE (UACMP) WITH MICROSCOPIC
Bilirubin Urine: NEGATIVE
Glucose, UA: >=500 mg/dL — AB
Ketones, ur: NEGATIVE mg/dL
Nitrite: NEGATIVE
Protein, ur: 100 mg/dL — AB
Specific Gravity, Urine: 1.035 — ABNORMAL HIGH (ref 1.005–1.030)
Squamous Epithelial / HPF: NONE SEEN (ref 0–5)
WBC, UA: >50 WBC/hpf — ABNORMAL HIGH (ref 0–5)
pH: 5 (ref 5.0–8.0)

## 2021-05-23 LAB — RESP PANEL BY RT-PCR (FLU A&B, COVID) ARPGX2
Influenza A by PCR: NEGATIVE
Influenza B by PCR: NEGATIVE
SARS Coronavirus 2 by RT PCR: POSITIVE — AB

## 2021-05-23 LAB — BRAIN NATRIURETIC PEPTIDE: B Natriuretic Peptide: 75.9 pg/mL (ref 0.0–100.0)

## 2021-05-23 LAB — PROTIME-INR
INR: 1.3 — ABNORMAL HIGH (ref 0.8–1.2)
INR: 1.4 — ABNORMAL HIGH (ref 0.8–1.2)
Prothrombin Time: 16 seconds — ABNORMAL HIGH (ref 11.4–15.2)
Prothrombin Time: 17.5 seconds — ABNORMAL HIGH (ref 11.4–15.2)

## 2021-05-23 LAB — APTT
aPTT: 39 seconds — ABNORMAL HIGH (ref 24–36)
aPTT: 45 seconds — ABNORMAL HIGH (ref 24–36)

## 2021-05-23 LAB — TROPONIN I (HIGH SENSITIVITY)
Troponin I (High Sensitivity): 49 ng/L — ABNORMAL HIGH (ref <18)
Troponin I (High Sensitivity): 45 ng/L — ABNORMAL HIGH (ref <18)
Troponin I (High Sensitivity): 41 ng/L — ABNORMAL HIGH (ref <18)
Troponin I (High Sensitivity): 46 ng/L — ABNORMAL HIGH (ref <18)

## 2021-05-23 LAB — TSH: TSH: 2.541 u[IU]/mL (ref 0.350–4.500)

## 2021-05-23 LAB — GLUCOSE, CAPILLARY
Glucose-Capillary: 140 mg/dL — ABNORMAL HIGH (ref 70–99)
Glucose-Capillary: 94 mg/dL (ref 70–99)

## 2021-05-23 LAB — T4, FREE: Free T4: 1.21 ng/dL — ABNORMAL HIGH (ref 0.61–1.12)

## 2021-05-23 LAB — LIPASE, BLOOD: Lipase: 23 U/L (ref 11–51)

## 2021-05-23 MED ORDER — ACETAMINOPHEN 650 MG RE SUPP: 650.0000 mg | Freq: Four times a day (QID) | RECTAL | Status: DC | PRN

## 2021-05-23 MED ORDER — CARVEDILOL 12.5 MG PO TABS
12.5000 mg | ORAL_TABLET | Freq: Two times a day (BID) | ORAL | Status: DC
  Filled 2021-05-23: qty 2

## 2021-05-23 MED ORDER — CHLORHEXIDINE GLUCONATE CLOTH 2 % EX PADS
6.0000 | MEDICATED_PAD | Freq: Every day | CUTANEOUS | Status: AC
  Administered 2021-05-24 – 2021-05-28 (×5): 6 via TOPICAL

## 2021-05-23 MED ORDER — TRAZODONE HCL 50 MG PO TABS
50.0000 mg | ORAL_TABLET | Freq: Every day | ORAL | Status: DC
  Filled 2021-05-23: qty 1

## 2021-05-23 MED ORDER — IPRATROPIUM-ALBUTEROL 20-100 MCG/ACT IN AERS
1.0000 | INHALATION_SPRAY | Freq: Two times a day (BID) | RESPIRATORY_TRACT | Status: DC
  Administered 2021-05-24 – 2021-06-01 (×11): 1 via RESPIRATORY_TRACT
  Filled 2021-05-23: qty 4

## 2021-05-23 MED ORDER — SODIUM CHLORIDE 0.9 % IV BOLUS (SEPSIS)
1000.0000 mL | Freq: Once | INTRAVENOUS | Status: AC
  Administered 2021-05-24: 1000 mL via INTRAVENOUS

## 2021-05-23 MED ORDER — METRONIDAZOLE 500 MG/100ML IV SOLN
500.0000 mg | Freq: Once | INTRAVENOUS | Status: AC
  Administered 2021-05-23: 500 mg via INTRAVENOUS
  Filled 2021-05-23: qty 100

## 2021-05-23 MED ORDER — SODIUM CHLORIDE 0.9 % IV BOLUS
1000.0000 mL | Freq: Once | INTRAVENOUS | Status: AC
Start: 2021-05-23 — End: 2021-05-24
  Administered 2021-05-23: 1000 mL via INTRAVENOUS

## 2021-05-23 MED ORDER — SODIUM CHLORIDE 0.9 % IV SOLN
200.0000 mg | Freq: Once | INTRAVENOUS | Status: AC
  Administered 2021-05-23: 200 mg via INTRAVENOUS
  Filled 2021-05-23: qty 200

## 2021-05-23 MED ORDER — CLOPIDOGREL BISULFATE 75 MG PO TABS: 75.0000 mg | ORAL_TABLET | Freq: Every day | ORAL | Status: DC

## 2021-05-23 MED ORDER — ASPIRIN EC 81 MG PO TBEC
81.0000 mg | DELAYED_RELEASE_TABLET | Freq: Every day | ORAL | Status: DC
  Filled 2021-05-23: qty 1

## 2021-05-23 MED ORDER — IPRATROPIUM-ALBUTEROL 0.5-2.5 (3) MG/3ML IN SOLN
3.0000 mL | Freq: Two times a day (BID) | RESPIRATORY_TRACT | Status: DC
  Filled 2021-05-23: qty 3

## 2021-05-23 MED ORDER — ATORVASTATIN CALCIUM 20 MG PO TABS
40.0000 mg | ORAL_TABLET | Freq: Every day | ORAL | Status: DC
  Filled 2021-05-23: qty 2

## 2021-05-23 MED ORDER — ACETAMINOPHEN 325 MG PO TABS
650.0000 mg | ORAL_TABLET | Freq: Four times a day (QID) | ORAL | Status: DC | PRN
  Administered 2021-05-29: 650 mg via ORAL
  Filled 2021-05-23: qty 2

## 2021-05-23 MED ORDER — NICOTINE 21 MG/24HR TD PT24
21.0000 mg | MEDICATED_PATCH | Freq: Every day | TRANSDERMAL | Status: DC
  Administered 2021-05-23 – 2021-06-01 (×10): 21 mg via TRANSDERMAL
  Filled 2021-05-23 (×10): qty 1

## 2021-05-23 MED ORDER — MUPIROCIN 2 % EX OINT
1.0000 "application " | TOPICAL_OINTMENT | Freq: Two times a day (BID) | CUTANEOUS | Status: AC
  Administered 2021-05-23 – 2021-05-28 (×10): 1 via NASAL
  Filled 2021-05-23: qty 22

## 2021-05-23 MED ORDER — SODIUM CHLORIDE 0.9 % IV BOLUS
1000.0000 mL | Freq: Once | INTRAVENOUS | Status: AC
  Administered 2021-05-23: 1000 mL via INTRAVENOUS

## 2021-05-23 MED ORDER — AMLODIPINE BESYLATE 5 MG PO TABS: 5.0000 mg | ORAL_TABLET | Freq: Every day | ORAL | Status: DC

## 2021-05-23 MED ORDER — VANCOMYCIN HCL 1250 MG/250ML IV SOLN
1250.0000 mg | Freq: Once | INTRAVENOUS | Status: AC
  Administered 2021-05-23: 1250 mg via INTRAVENOUS
  Filled 2021-05-23: qty 250

## 2021-05-23 MED ORDER — SODIUM CHLORIDE 0.9 % IV SOLN
100.0000 mg | Freq: Every day | INTRAVENOUS | Status: AC
  Administered 2021-05-24 – 2021-05-27 (×4): 100 mg via INTRAVENOUS
  Filled 2021-05-23: qty 20
  Filled 2021-05-23: qty 100
  Filled 2021-05-23: qty 20
  Filled 2021-05-23: qty 100
  Filled 2021-05-23: qty 20

## 2021-05-23 MED ORDER — KETOROLAC TROMETHAMINE 30 MG/ML IJ SOLN: 15.0000 mg | Freq: Four times a day (QID) | INTRAMUSCULAR | Status: DC | PRN

## 2021-05-23 MED ORDER — ENOXAPARIN SODIUM 40 MG/0.4ML IJ SOSY
40.0000 mg | PREFILLED_SYRINGE | INTRAMUSCULAR | Status: DC
  Filled 2021-05-23: qty 0.4

## 2021-05-23 MED ORDER — INSULIN ASPART 100 UNIT/ML IJ SOLN
0.0000 [IU] | Freq: Three times a day (TID) | INTRAMUSCULAR | Status: DC
  Administered 2021-05-23: 2 [IU] via SUBCUTANEOUS
  Filled 2021-05-23: qty 1

## 2021-05-23 MED ORDER — SODIUM CHLORIDE 0.45 % IV SOLN: INTRAVENOUS | Status: DC

## 2021-05-23 MED ORDER — PIPERACILLIN-TAZOBACTAM 3.375 G IVPB
3.3750 g | Freq: Three times a day (TID) | INTRAVENOUS | Status: DC
  Administered 2021-05-23 – 2021-05-29 (×19): 3.375 g via INTRAVENOUS
  Filled 2021-05-23 (×19): qty 50

## 2021-05-23 MED ORDER — BENZTROPINE MESYLATE 0.5 MG PO TABS
0.5000 mg | ORAL_TABLET | Freq: Two times a day (BID) | ORAL | Status: DC
  Filled 2021-05-23 (×5): qty 1

## 2021-05-23 MED ORDER — IOHEXOL 350 MG/ML SOLN
80.0000 mL | Freq: Once | INTRAVENOUS | Status: AC | PRN
  Administered 2021-05-23: 80 mL via INTRAVENOUS

## 2021-05-23 MED ORDER — PALIPERIDONE ER 3 MG PO TB24
9.0000 mg | ORAL_TABLET | Freq: Every day | ORAL | Status: DC
  Filled 2021-05-23 (×3): qty 3

## 2021-05-23 MED ORDER — SODIUM CHLORIDE 0.9 % IV SOLN
2.0000 g | Freq: Once | INTRAVENOUS | Status: AC
  Administered 2021-05-23: 2 g via INTRAVENOUS
  Filled 2021-05-23: qty 2

## 2021-05-23 MED ORDER — TRIHEXYPHENIDYL HCL 5 MG PO TABS
5.0000 mg | ORAL_TABLET | Freq: Two times a day (BID) | ORAL | Status: DC
  Filled 2021-05-23 (×4): qty 1

## 2021-05-23 MED ORDER — DONEPEZIL HCL 5 MG PO TABS
10.0000 mg | ORAL_TABLET | Freq: Every day | ORAL | Status: DC
  Filled 2021-05-23 (×3): qty 2

## 2021-05-23 NOTE — ED Notes (Signed)
Pt remains cold-temperature on bair hugger turned up to high. Room temperature turned up. Pt given more warm blankets on top of bair hugger. Will continue to monitor.

## 2021-05-23 NOTE — ED Notes (Signed)
Pt very hard stick. Attempted x2 for another IV and cultures. Called lab to collect other set of blood cultures. One set able to be obtained.
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
## 2021-05-23 NOTE — Consult Note (Signed)
Remdesivir - Pharmacy Brief Note   O:  ALT: 67 CXR: No acute findings SpO2: 97% on RA   A/P:  Remdesivir 200 mg IVPB once followed by 100 mg IVPB daily x 4 days.   Derrek Gu, PharmD 05/23/2021 2:29 PM

## 2021-05-23 NOTE — ED Notes (Signed)
Positive covid reported to Dr Sidney Ace
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
## 2021-05-23 NOTE — Progress Notes (Signed)
Sepsis tracking by [facility] 

## 2021-05-23 NOTE — ED Triage Notes (Signed)
Pt comes ems from assisted living with 3 falls in past two days. Staff noticed AMS, slurred speech yesterday. Called today for same. Pt has fixed pinpoint pupils, slurred speech, sometimes is responsive and sometimes is snoring. Pt is bradycardic with ems in 30s, given 0.5mg  atropine and up t0 51. CBG 219. BP was originally 70/30 and with fluids is now 111/69.

## 2021-05-23 NOTE — ED Notes (Signed)
Lab at bedside
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
## 2021-05-23 NOTE — ED Notes (Signed)
Bair hugger applied.

## 2021-05-23 NOTE — ED Notes (Signed)
Spoke with md-proceed with ct abdomen without labs

## 2021-05-23 NOTE — ED Notes (Signed)
US at bedside

## 2021-05-24 ENCOUNTER — Encounter: Payer: Self-pay | Admitting: Internal Medicine

## 2021-05-24 ENCOUNTER — Inpatient Hospital Stay: Payer: Medicare Other

## 2021-05-24 DIAGNOSIS — R4182 Altered mental status, unspecified: Secondary | ICD-10-CM

## 2021-05-24 DIAGNOSIS — I491 Atrial premature depolarization: Secondary | ICD-10-CM

## 2021-05-24 DIAGNOSIS — F209 Schizophrenia, unspecified: Secondary | ICD-10-CM

## 2021-05-24 DIAGNOSIS — J449 Chronic obstructive pulmonary disease, unspecified: Secondary | ICD-10-CM | POA: Diagnosis not present

## 2021-05-24 DIAGNOSIS — Z66 Do not resuscitate: Secondary | ICD-10-CM

## 2021-05-24 DIAGNOSIS — R001 Bradycardia, unspecified: Secondary | ICD-10-CM

## 2021-05-24 DIAGNOSIS — U071 COVID-19: Secondary | ICD-10-CM

## 2021-05-24 DIAGNOSIS — I4581 Long QT syndrome: Secondary | ICD-10-CM

## 2021-05-24 DIAGNOSIS — Z515 Encounter for palliative care: Secondary | ICD-10-CM

## 2021-05-24 LAB — BLOOD GAS, VENOUS
Acid-Base Excess: 1.9 mmol/L (ref 0.0–2.0)
Bicarbonate: 27.7 mmol/L (ref 20.0–28.0)
O2 Saturation: 70.2 %
Patient temperature: 37
pCO2, Ven: 48 mmHg (ref 44.0–60.0)
pH, Ven: 7.37 (ref 7.250–7.430)
pO2, Ven: 38 mmHg (ref 32.0–45.0)

## 2021-05-24 LAB — URINE DRUG SCREEN, QUALITATIVE (ARMC ONLY)
Amphetamines, Ur Screen: NOT DETECTED
Barbiturates, Ur Screen: NOT DETECTED
Benzodiazepine, Ur Scrn: NOT DETECTED
Cannabinoid 50 Ng, Ur ~~LOC~~: NOT DETECTED
Cocaine Metabolite,Ur ~~LOC~~: NOT DETECTED
MDMA (Ecstasy)Ur Screen: NOT DETECTED
Methadone Scn, Ur: NOT DETECTED
Opiate, Ur Screen: NOT DETECTED
Phencyclidine (PCP) Ur S: NOT DETECTED
Tricyclic, Ur Screen: NOT DETECTED

## 2021-05-24 LAB — CBC
HCT: 27.7 % — ABNORMAL LOW (ref 39.0–52.0)
Hemoglobin: 9.3 g/dL — ABNORMAL LOW (ref 13.0–17.0)
MCH: 32.2 pg (ref 26.0–34.0)
MCHC: 33.6 g/dL (ref 30.0–36.0)
MCV: 95.8 fL (ref 80.0–100.0)
Platelets: 79 10*3/uL — ABNORMAL LOW (ref 150–400)
RBC: 2.89 MIL/uL — ABNORMAL LOW (ref 4.22–5.81)
RDW: 16 % — ABNORMAL HIGH (ref 11.5–15.5)
WBC: 6.2 10*3/uL (ref 4.0–10.5)
nRBC: 0.8 % — ABNORMAL HIGH (ref 0.0–0.2)

## 2021-05-24 LAB — GLUCOSE, CAPILLARY
Glucose-Capillary: 75 mg/dL (ref 70–99)
Glucose-Capillary: 83 mg/dL (ref 70–99)
Glucose-Capillary: 146 mg/dL — ABNORMAL HIGH (ref 70–99)
Glucose-Capillary: 142 mg/dL — ABNORMAL HIGH (ref 70–99)
Glucose-Capillary: 152 mg/dL — ABNORMAL HIGH (ref 70–99)

## 2021-05-24 LAB — HIV ANTIBODY (ROUTINE TESTING W REFLEX): HIV Screen 4th Generation wRfx: NONREACTIVE

## 2021-05-24 LAB — HEMOGLOBIN A1C
Hgb A1c MFr Bld: 7.9 % — ABNORMAL HIGH (ref 4.8–5.6)
Mean Plasma Glucose: 180 mg/dL

## 2021-05-24 LAB — MAGNESIUM
Magnesium: 1.3 mg/dL — ABNORMAL LOW (ref 1.7–2.4)
Magnesium: 3.4 mg/dL — ABNORMAL HIGH (ref 1.7–2.4)

## 2021-05-24 LAB — BASIC METABOLIC PANEL
Anion gap: 5 (ref 5–15)
BUN: 31 mg/dL — ABNORMAL HIGH (ref 8–23)
CO2: 27 mmol/L (ref 22–32)
Calcium: 8.3 mg/dL — ABNORMAL LOW (ref 8.9–10.3)
Chloride: 109 mmol/L (ref 98–111)
Creatinine, Ser: 1.29 mg/dL — ABNORMAL HIGH (ref 0.61–1.24)
GFR, Estimated: 56 mL/min — ABNORMAL LOW (ref >60)
Glucose, Bld: 97 mg/dL (ref 70–99)
Potassium: 4.5 mmol/L (ref 3.5–5.1)
Sodium: 141 mmol/L (ref 135–145)

## 2021-05-24 LAB — LACTATE DEHYDROGENASE: LDH: 168 U/L (ref 98–192)

## 2021-05-24 LAB — FERRITIN: Ferritin: 194 ng/mL (ref 24–336)

## 2021-05-24 LAB — LACTIC ACID, PLASMA: Lactic Acid, Venous: 0.8 mmol/L (ref 0.5–1.9)

## 2021-05-24 LAB — CORTISOL: Cortisol, Plasma: 21.1 ug/dL

## 2021-05-24 LAB — PROCALCITONIN
Procalcitonin: <0.1 ng/mL
Procalcitonin: <0.1 ng/mL

## 2021-05-24 LAB — D-DIMER, QUANTITATIVE: D-Dimer, Quant: 1.65 ug/mL-FEU — ABNORMAL HIGH (ref 0.00–0.50)

## 2021-05-24 LAB — PHOSPHORUS: Phosphorus: 3 mg/dL (ref 2.5–4.6)

## 2021-05-24 MED ORDER — PANTOPRAZOLE SODIUM 40 MG IV SOLR
40.0000 mg | INTRAVENOUS | Status: DC
  Administered 2021-05-24 – 2021-05-29 (×6): 40 mg via INTRAVENOUS
  Filled 2021-05-24 (×6): qty 40

## 2021-05-24 MED ORDER — NOREPINEPHRINE 4 MG/250ML-% IV SOLN
2.0000 ug/min | INTRAVENOUS | Status: DC
Start: 2021-05-24 — End: 2021-05-26
  Administered 2021-05-24: 2 ug/min via INTRAVENOUS
  Filled 2021-05-24: qty 250

## 2021-05-24 MED ORDER — POTASSIUM CHLORIDE 10 MEQ/100ML IV SOLN
10.0000 meq | INTRAVENOUS | Status: AC
Start: 2021-05-24 — End: 2021-05-24
  Administered 2021-05-24 (×4): 10 meq via INTRAVENOUS
  Filled 2021-05-24 (×4): qty 100

## 2021-05-24 MED ORDER — HYDROCORTISONE SOD SUC (PF) 100 MG IJ SOLR
100.0000 mg | Freq: Once | INTRAMUSCULAR | Status: AC
  Administered 2021-05-24: 100 mg via INTRAVENOUS
  Filled 2021-05-24: qty 2

## 2021-05-24 MED ORDER — SODIUM CHLORIDE 0.9 % IV SOLN
250.0000 mL | INTRAVENOUS | Status: DC
  Administered 2021-05-24: 250 mL via INTRAVENOUS

## 2021-05-24 MED ORDER — MAGNESIUM SULFATE 2 GM/50ML IV SOLN
2.0000 g | Freq: Once | INTRAVENOUS | Status: AC
  Administered 2021-05-24: 2 g via INTRAVENOUS
  Filled 2021-05-24: qty 50

## 2021-05-24 MED ORDER — HYDROCORTISONE SOD SUC (PF) 100 MG IJ SOLR
50.0000 mg | Freq: Two times a day (BID) | INTRAMUSCULAR | Status: DC
  Administered 2021-05-24 – 2021-05-28 (×9): 50 mg via INTRAVENOUS
  Filled 2021-05-24 (×9): qty 2

## 2021-05-24 MED ORDER — INSULIN ASPART 100 UNIT/ML IJ SOLN
0.0000 [IU] | INTRAMUSCULAR | Status: DC
Start: 2021-05-24 — End: 2021-05-24

## 2021-05-24 MED ORDER — MAGNESIUM SULFATE 4 GM/100ML IV SOLN
4.0000 g | Freq: Once | INTRAVENOUS | Status: AC
  Administered 2021-05-24: 4 g via INTRAVENOUS
  Filled 2021-05-24: qty 100

## 2021-05-24 MED ORDER — ATROPINE SULFATE 1 MG/10ML IJ SOSY: 0.5000 mg | PREFILLED_SYRINGE | Freq: Once | INTRAMUSCULAR | Status: DC | PRN

## 2021-05-24 MED ORDER — DEXTROSE-NACL 5-0.45 % IV SOLN: INTRAVENOUS | Status: AC
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
## 2021-05-24 NOTE — Consult Note (Signed)
Consultation Note Date: 05/24/2021   Patient Name: Terrence Johnson  DOB: 1941-05-15  MRN: [id number]  Age / Sex: 80 y.o., male  PCP: Patient, No Pcp Per (Inactive) Referring Physician: Vida Rigger, MD  Reason for Consultation: Establishing goals of care  HPI/Patient Profile: 80 y.o. male   admitted on 05/23/2021 with past medical history significant of schizophrenia, diabetes, cognitive impairment, dementia, COPD who presents with altered mental status.    Patient lives in a group home "[facility]"    According to facility patient has had continued physical and functional decline with increasing nursing care needs.   Patient had a fall  at his facility, they noticed some slurred speech and he was having difficulty ambulating.   EMS transported,  was bradycardic in the 30s and hypotensive 70s over 50s.  He received 0.5 mg of atropine and some fluid and blood pressure came up as did his heart rate.   He was admitted for treatment and stabilization.     He was found to be Covid positive.   Treatment option decisions, advanced directive decisions and anticipatory care needs are are important decisions pending.   Clinical Assessment and Goals of Care:   This NP Lorinda Creed reviewed medical records, received report from team, and then spoke to his brother/ Terrence Johnson by telephone   to discuss diagnosis, prognosis, GOC, EOL wishes disposition and options.   Concept of Palliative Care was introduced as specialized medical care for people and their families living with serious illness.  If focuses on providing relief from the symptoms and stress of a serious illness.  The goal is to improve quality of life for both the patient and the family.Values and goals of care important to patient and family were attempted to be elicited.    A  discussion was had today regarding advanced directives.  Concepts  specific to code status, artifical feeding and hydration, continued IV antibiotics and rehospitalization was had.  The difference between a aggressive medical intervention path  and a palliative comfort care path for this patient at this time was had.     Created space and opportunity for brother to explore thoughts and feelings regarding patient's current medical situation.  Terrence verbalizes an understanding of the seriousness of his brother's current medical situation and a clear understanding of his continued physical and functional decline over the past many months.  He speaks to the patient having difficulties throughout his lifetime.  He has been living in group homes for many years, recently moved secondary to increasing care needs.  Hope is for improvement and return to baseline, ultimately comfort and dignity are priorities for Mr. Motyka.    Questions and concerns addressed.  Patient  encouraged to call with questions or concerns.     PMT will continue to support holistically.          Silas Flood  #  [phone]  Director of Group Home/ [facility]   No documented HPOA or ACP documents.  However I  was able to easily reach and speak with his brother Terrence Johnson #  [phone] who is willing to advocate for his brother regarding healthcare decisions.      SUMMARY OF RECOMMENDATIONS    Code Status/Advance Care Planning: DNR- docuemtned today after discussion with brother/ Terrence Tlatelpa No artificial feeding now or in the future   Palliative Prophylaxis:  Aspiration, Bowel Regimen, Delirium Protocol, Frequent Pain Assessment, and Oral Care  Additional Recommendations (Limitations, Scope, Preferences): Full Scope Treatment-treat the treatable, hope for improvement.  However if patient does not respond to full medical support, and continues to decline family would opt for comfort path.  Psycho-social/Spiritual:  Desire for further Chaplaincy support:no Additional  Recommendations: Education on Hospice  Prognosis:  Unable to determine  Discharge Planning: To Be Determined      Primary Diagnoses: Present on Admission:  Acute cystitis  Schizophrenia (HCC)  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HCC)  Type 2 diabetes mellitus with hyperlipidemia (HCC)  CKD (chronic kidney disease), stage IIIa  HLD (hyperlipidemia)  Hypertension  COPD (chronic obstructive pulmonary disease) (HCC)  Electrocardiography suggestive of ST elevation myocardial infarction (STEMI) (HCC)  Diverticulitis of cecum   I have reviewed the medical record, interviewed the patient and family, and examined the patient. The following aspects are pertinent.  Past Medical History:  Diagnosis Date   Cognitive impairment    COPD (chronic obstructive pulmonary disease) (HCC)    Diabetes mellitus without complication (HCC)    Hypercholesteremia    Hypertension    Psychotic disorder (HCC)    Social History   Socioeconomic History   Marital status: Single    Spouse name: Not on file   Number of children: Not on file   Years of education: Not on file   Highest education level: Not on file  Occupational History   Not on file  Tobacco Use   Smoking status: Every Day   Smokeless tobacco: Never  Substance and Sexual Activity   Alcohol use: No   Drug use: No   Sexual activity: Not on file  Other Topics Concern   Not on file  Social History Narrative   Not on file   Social Determinants of Health   Financial Resource Strain: Not on file  Food Insecurity: Not on file  Transportation Needs: Not on file  Physical Activity: Not on file  Stress: Not on file  Social Connections: Not on file   Family History  Problem Relation Age of Onset   Hypertension Mother    Scheduled Meds:  aspirin EC  81 mg Oral Daily   atorvastatin  40 mg Oral QHS   benztropine  0.5 mg Oral BID   Chlorhexidine Gluconate Cloth  6 each Topical Q0600   clopidogrel  75 mg Oral Daily   donepezil  10 mg Oral QHS    hydrocortisone sod succinate (SOLU-CORTEF) inj  50 mg Intravenous Q12H   Ipratropium-Albuterol  1 puff Inhalation BID   mupirocin ointment  1 application Nasal BID   nicotine  21 mg Transdermal Daily   paliperidone  9 mg Oral QHS   pantoprazole (PROTONIX) IV  40 mg Intravenous Q24H   trihexyphenidyl  5 mg Oral BID WC   Continuous Infusions:  sodium chloride Stopped (05/24/21 0753)   dextrose 5 % and 0.45% NaCl 75 mL/hr at 05/24/21 1200   norepinephrine (LEVOPHED) Adult infusion 2 mcg/min (05/24/21 1200)   piperacillin-tazobactam Stopped (05/24/21 1028)   remdesivir 100 mg in NS 100 mL Stopped (05/24/21 1020)   PRN Meds:.acetaminophen **  OR** acetaminophen, atropine Medications Prior to Admission:  Prior to Admission medications   Medication Sig Start Date End Date Taking? Authorizing Provider  amLODipine (NORVASC) 5 MG tablet Take 1 tablet (5 mg total) by mouth daily. 12/19/20  Yes Wieting, Richard, MD  aspirin EC 81 MG tablet Take 81 mg by mouth daily.   Yes [provider]  atorvastatin (LIPITOR) 40 MG tablet Take 40 mg by mouth at bedtime.   Yes [provider]  benztropine (COGENTIN) 0.5 MG tablet Take 0.5 mg by mouth 2 (two) times daily.   Yes [provider]  carvedilol (COREG) 12.5 MG tablet Take 12.5 mg by mouth 2 (two) times daily with a meal.   Yes [provider]  Cholecalciferol (VITAMIN D-3) 125 MCG (5000 UT) TABS Take 5,000 Units by mouth daily.   Yes [provider]  clopidogrel (PLAVIX) 75 MG tablet Take 75 mg by mouth daily.   Yes [provider]  donepezil (ARICEPT) 10 MG tablet Take 10 mg by mouth daily. 03/25/20  Yes [provider]  Ipratropium-Albuterol (COMBIVENT) 20-100 MCG/ACT AERS respimat Inhale 1 puff into the lungs 2 (two) times daily.   Yes [provider]  Lactobacillus (PROBIOTIC ACIDOPHILUS) CAPS Take 1 capsule by mouth daily.   Yes [provider]  paliperidone (INVEGA) 9 MG 24  hr tablet Take 9 mg by mouth at bedtime.   Yes [provider]  polyethylene glycol (MIRALAX / GLYCOLAX) 17 g packet Take 17 g by mouth every 3 (three) days.   Yes [provider]  traZODone (DESYREL) 50 MG tablet Take 50 mg by mouth at bedtime.   Yes [provider]  trihexyphenidyl (ARTANE) 5 MG tablet Take 5 mg by mouth 2 (two) times daily with a meal.   Yes [provider]  azithromycin (ZITHROMAX) 250 MG tablet One tab po daily for four days Patient not taking: No sig reported 12/19/20   Alford Highland, MD  Lidocaine (HM LIDOCAINE PATCH) 4 % PTCH Apply 1 patch topically daily as needed. 01/24/21   Lucy Chris, PA  nicotine (NICODERM CQ - DOSED IN MG/24 HOURS) 21 mg/24hr patch Place 1 patch (21 mg total) onto the skin daily. Patient not taking: Reported on 05/23/2021 12/19/20   Alford Highland, MD   No Known Allergies Review of Systems  Unable to perform ROS: Mental status change   Physical Exam  Vital Signs: BP (!) 103/46   Pulse 76   Temp 99.1 F (37.3 C)   Resp (!) 23   Ht 5\' 7"  (1.702 m)   Wt 69.3 kg   SpO2 100%   BMI 23.93 kg/m  Pain Scale: PAINAD       SpO2: SpO2: 100 % O2 Device:SpO2: 100 % O2 Flow Rate: .   IO: Intake/output summary:  Intake/Output Summary (Last 24 hours) at 05/24/2021 1359 Last data filed at 05/24/2021 1200 Gross per 24 hour  Intake 3900.94 ml  Output 245 ml  Net 3655.94 ml    LBM:   Baseline Weight: Weight: 68 kg Most recent weight: Weight: 69.3 kg     Palliative Assessment/Data: 20 %    Discussed with Dr 07/24/2021 and bedside RN via secure chat  Time In: 1400 Time Out: 1510 Time Total: 70 minutes Greater than 50%  of this time was spent counseling and coordinating care related to the above assessment and plan.  Signed by: 1511, NP   Please contact Palliative Medicine Team phone at [phone] for  questions and concerns.  For individual provider: See Shea Evans

## 2021-05-24 NOTE — Progress Notes (Signed)
Attempted to contact pts brother Archie Paladino via telephone utilizing phone number listed under pt contacts to inform him his brother is hospitalized.  However, he did not answer the phone and unable to leave a voicemail message.  Will attempt to contact him again later today.   Camie Patience, AGNP  Pulmonary/Critical Care Pager [phone] (please enter 7 digits) PCCM Consult Pager [phone] (please enter 7 digits)
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
## 2021-05-25 DIAGNOSIS — F442 Dissociative stupor: Secondary | ICD-10-CM

## 2021-05-25 DIAGNOSIS — R410 Disorientation, unspecified: Secondary | ICD-10-CM

## 2021-05-25 LAB — MAGNESIUM: Magnesium: 3 mg/dL — ABNORMAL HIGH (ref 1.7–2.4)

## 2021-05-25 LAB — CBC
HCT: 26.6 % — ABNORMAL LOW (ref 39.0–52.0)
Hemoglobin: 9.1 g/dL — ABNORMAL LOW (ref 13.0–17.0)
MCH: 32.3 pg (ref 26.0–34.0)
MCHC: 34.2 g/dL (ref 30.0–36.0)
MCV: 94.3 fL (ref 80.0–100.0)
Platelets: 74 10*3/uL — ABNORMAL LOW (ref 150–400)
RBC: 2.82 MIL/uL — ABNORMAL LOW (ref 4.22–5.81)
RDW: 16 % — ABNORMAL HIGH (ref 11.5–15.5)
WBC: 5.6 10*3/uL (ref 4.0–10.5)
nRBC: 0.7 % — ABNORMAL HIGH (ref 0.0–0.2)

## 2021-05-25 LAB — GLUCOSE, CAPILLARY
Glucose-Capillary: 151 mg/dL — ABNORMAL HIGH (ref 70–99)
Glucose-Capillary: 152 mg/dL — ABNORMAL HIGH (ref 70–99)
Glucose-Capillary: 153 mg/dL — ABNORMAL HIGH (ref 70–99)
Glucose-Capillary: 156 mg/dL — ABNORMAL HIGH (ref 70–99)
Glucose-Capillary: 161 mg/dL — ABNORMAL HIGH (ref 70–99)
Glucose-Capillary: 179 mg/dL — ABNORMAL HIGH (ref 70–99)

## 2021-05-25 LAB — BASIC METABOLIC PANEL
Anion gap: 5 (ref 5–15)
BUN: 30 mg/dL — ABNORMAL HIGH (ref 8–23)
CO2: 25 mmol/L (ref 22–32)
Calcium: 8.3 mg/dL — ABNORMAL LOW (ref 8.9–10.3)
Chloride: 110 mmol/L (ref 98–111)
Creatinine, Ser: 1.43 mg/dL — ABNORMAL HIGH (ref 0.61–1.24)
GFR, Estimated: 50 mL/min — ABNORMAL LOW (ref >60)
Glucose, Bld: 157 mg/dL — ABNORMAL HIGH (ref 70–99)
Potassium: 3.7 mmol/L (ref 3.5–5.1)
Sodium: 140 mmol/L (ref 135–145)

## 2021-05-25 LAB — PROCALCITONIN: Procalcitonin: <0.1 ng/mL

## 2021-05-25 LAB — LACTATE DEHYDROGENASE: LDH: 205 U/L — ABNORMAL HIGH (ref 98–192)

## 2021-05-25 LAB — D-DIMER, QUANTITATIVE: D-Dimer, Quant: 2.69 ug/mL-FEU — ABNORMAL HIGH (ref 0.00–0.50)

## 2021-05-25 LAB — FERRITIN: Ferritin: 150 ng/mL (ref 24–336)

## 2021-05-25 LAB — PHOSPHORUS: Phosphorus: 3.5 mg/dL (ref 2.5–4.6)

## 2021-05-25 MED ORDER — CHLORHEXIDINE GLUCONATE 0.12 % MT SOLN
15.0000 mL | Freq: Two times a day (BID) | OROMUCOSAL | Status: DC
  Administered 2021-05-25 – 2021-06-01 (×14): 15 mL via OROMUCOSAL
  Filled 2021-05-25 (×8): qty 15

## 2021-05-25 MED ORDER — ORAL CARE MOUTH RINSE
15.0000 mL | Freq: Two times a day (BID) | OROMUCOSAL | Status: DC
  Administered 2021-05-25 – 2021-06-01 (×12): 15 mL via OROMUCOSAL

## 2021-05-25 NOTE — TOC Initial Note (Signed)
Transition of Care [facility]) - Initial/Assessment Note    Patient Details  Name: Terrence Johnson MRN: [id number] Date of Birth: [birth year]  Transition of Care [facility]) CM/SW Contact:    Marina Goodell Phone Number: [phone] 05/25/2021, 5:47 PM  Clinical Narrative:                  Patient presents to [facility] due to 3 recent falls.  Staff at group home noticed slurred speech and called EMS.  Patient is COVID-19 positive. Patient is resident at [facility] and main contact is patient's [place], [employer], corporate) [phone] [facility] Phone).  Patient is able to perform most ADLs without assistance. Patient's vitals are stable but is still experiencing encephalopathy.    Expected Discharge Plan: Skilled Nursing Facility Barriers to Discharge: Continued Medical Work up   Patient Goals and CMS Choice        Expected Discharge Plan and Services Expected Discharge Plan: Skilled Nursing Facility In-house Referral: Clinical Social Work     Living arrangements for the past 2 months: Assisted Living Facility ([facility])                                      Prior Living Arrangements/Services Living arrangements for the past 2 months: Assisted Living Facility ([facility])            Need for Family Participation in Patient Care: Yes (Comment) Care giver support system in place?: Yes (comment)   Criminal Activity/Legal Involvement Pertinent to Current Situation/Hospitalization: Yes - Comment as needed  Activities of Daily Living      Permission Sought/Granted Permission sought to share information with : Family Supports    Share Information with NAME: Pavan, Bring (Brother)   [phone] (Home Phone)     Permission granted to share info w Relationship: [facility]     Emotional Assessment Appearance:: Appears older than stated age   Affect (typically observed): Unable to Assess Orientation: : Fluctuating  Orientation (Suspected and/or reported Sundowners) Alcohol / Substance Use: Not Applicable Psych Involvement: Yes (comment)  Admission diagnosis:  Diverticulitis [K57.92] RUQ pain [R10.11] Diverticulitis of cecum [K57.32] Hypothermia, initial encounter [T68.XXXA] Urinary tract infection without hematuria, site unspecified [N39.0] Altered mental status, unspecified altered mental status type [R41.82] Patient Active Problem List   Diagnosis Date Noted   Acute delirium 05/25/2021   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   Electrocardiography suggestive of ST elevation myocardial infarction (STEMI) (HCC) 05/23/2021   Diverticulitis of cecum 05/23/2021   COVID-19 virus infection 05/23/2021   Lobar pneumonia (HCC)    Hypokalemia    CKD stage 2 due to type 2 diabetes mellitus (HCC)    Acute kidney injury superimposed on CKD (HCC)    NSTEMI (non-ST elevated myocardial infarction) (HCC) 12/15/2020   Cognitive impairment    Type 2 diabetes mellitus with hyperlipidemia (HCC)    CKD (chronic kidney disease), stage IIIa    HLD (hyperlipidemia)    Hypertension    COPD (chronic obstructive pulmonary disease) (HCC)    Weakness    Onychomycosis 04/13/2020   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

## 2021-05-25 NOTE — Progress Notes (Signed)
Patient is alert and oriented to self. Follows commands. Unable to feed due to difficulty swallowing. PO medications held. Thrashing about in bed noted with sitter. Patient was repositioned and behaviors decreased. Temp is noted 93.9 via rectal temp sensor. Place back under the bair hugger. Provider made aware. Sitter continues at the bedside. Call bell in reach. Will continue to monitor.

## 2021-05-25 NOTE — Progress Notes (Signed)
SLP Cancellation Note  Patient Details Name: Terrence Johnson MRN: [id number] DOB: 1941-03-02   Cancelled treatment:       Reason Eval/Treat Not Completed: Patient not medically ready;Medical issues which prohibited therapy (chart reviewed; consulted NSG re: pt's status). Pt is not appropriate for po trials/intake at this time per NSG.  ST services will f/u w/ pt's status tomorrow for readiness for oral intake and diet. Recommend frequent oral care for hygiene and stimulation of swallowing. Aspiration precautions.     Jerilynn Som, MS, CCC-SLP Speech Language Pathologist Rehab Services [phone] [facility] 05/25/2021, 4:03 PM

## 2021-05-25 NOTE — Consult Note (Signed)
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 [facility], [address] [place], [place] [address]  Lactate dehydrogenase     Status: Abnormal   Collection Time: 05/25/21  6:13 AM  Result Value Ref Range   LDH 205 (H) 98 - 192 U/L    Comment: Performed at [facility], [address] [place], [place] [address]  D-dimer, quantitative     Status: Abnormal   Collection Time: 05/25/21  6:13 AM  Result Value Ref Range   D-Dimer, Quant 2.69 (H) 0.00 - 0.50 ug/mL-FEU    Comment: (NOTE) At the manufacturer cut-off value of 0.5 g/mL FEU, this assay has a negative predictive value of 95-100%.This assay is intended for use in conjunction with a clinical pretest probability (PTP) assessment model to exclude pulmonary embolism (PE) and deep venous thrombosis (DVT) in outpatients suspected of  PE or DVT. Results should be correlated with clinical presentation. Performed at [facility], [address] [place], [place] [address]   Phosphorus     Status: None   Collection Time: 05/25/21  6:13 AM  Result Value Ref Range   Phosphorus 3.5 2.5 - 4.6 mg/dL    Comment: Performed at [facility], [address] [place], [place] [address]  CBC     Status: Abnormal   Collection Time: 05/25/21  6:13 AM  Result Value Ref Range   WBC 5.6 4.0 - 10.5 K/uL   RBC 2.82 (L) 4.22 - 5.81 MIL/uL   Hemoglobin 9.1 (L) 13.0 - 17.0 g/dL   HCT 09.8 (L) 11.9 - 14.7 %   MCV 94.3 80.0 - 100.0 fL   MCH 32.3 26.0 - 34.0 pg   MCHC 34.2 30.0 - 36.0 g/dL   RDW 82.9 (H) 56.2 - 13.0 %   Platelets 74 (L) 150 - 400 K/uL    Comment: Immature Platelet Fraction may be clinically indicated, consider ordering this additional test [id number]    nRBC 0.7 (H) 0.0 - 0.2 %    Comment: Performed at [facility], [address] [place], [place] [address]  Glucose, capillary     Status: Abnormal   Collection Time: 05/25/21  9:08 AM  Result Value Ref Range   Glucose-Capillary 153 (H) 70 - 99 mg/dL    Comment: Glucose reference range applies only to samples taken after fasting for at least 8 hours.  Glucose, capillary     Status: Abnormal   Collection Time: 05/25/21 11:20 AM  Result Value Ref Range   Glucose-Capillary 151 (H) 70 - 99 mg/dL    Comment: Glucose reference  range applies only to samples taken after fasting for at least 8 hours.    Current Facility-Administered Medications  Medication Dose Route Frequency Provider Last Rate Last Admin   0.9 %  sodium chloride infusion  250 mL Intravenous Continuous Rust-Chester, Cecelia Byars, NP   Stopped at 05/24/21 0753   acetaminophen (TYLENOL) tablet 650 mg  650 mg Oral Q6H PRN Norins, Rosalyn Gess, MD       Or   acetaminophen (TYLENOL) suppository 650 mg  650 mg Rectal Q6H PRN Norins, Rosalyn Gess, MD       aspirin EC tablet 81 mg  81 mg Oral Daily Norins, Rosalyn Gess, MD       atorvastatin (LIPITOR) tablet 40 mg  40 mg Oral QHS Norins, Rosalyn Gess, MD       atropine 1 MG/10ML injection 0.5 mg  0.5 mg Intravenous Once PRN Rust-Chester, Micheline Rough L, NP       benztropine (COGENTIN) tablet 0.5 mg  0.5 mg Oral BID Norins, Rosalyn Gess, MD       Chlorhexidine Gluconate Cloth 2 % PADS 6 each  6 each Topical Q0600 Norins, Rosalyn Gess, MD   6 each at 05/25/21 0559   clopidogrel (PLAVIX) tablet 75 mg  75 mg Oral Daily Norins, Rosalyn Gess, MD       dextrose 5 %-0.45 % sodium chloride infusion   Intravenous Continuous Rust-Chester, Cecelia Byars, NP   Stopped at 05/25/21 0945   donepezil (ARICEPT) tablet 10 mg  10 mg Oral QHS Norins, Rosalyn Gess, MD       hydrocortisone sodium succinate (SOLU-CORTEF) 100 MG injection 50 mg  50 mg Intravenous Q12H Graves, Dana E, NP   50 mg at 05/25/21 0550   Ipratropium-Albuterol (COMBIVENT) respimat 1 puff  1  puff Inhalation BID Norins, Rosalyn Gess, MD   1 puff at 05/25/21 0948   mupirocin ointment (BACTROBAN) 2 % 1 application  1 application Nasal BID Norins, Rosalyn Gess, MD   1 application at 05/25/21 0947   nicotine (NICODERM CQ - dosed in mg/24 hours) patch 21 mg  21 mg Transdermal Daily Norins, Rosalyn Gess, MD   21 mg at 05/25/21 0950   norepinephrine (LEVOPHED) 4mg  in premix infusion  2-10 mcg/min Intravenous Titrated Rust-Chester, Cecelia Byars, NP   Stopped at 05/24/21 1913   paliperidone (INVEGA) 24 hr tablet  9 mg  9 mg Oral QHS Norins, Rosalyn Gess, MD       pantoprazole (PROTONIX) injection 40 mg  40 mg Intravenous Q24H Rust-Chester, Britton L, NP   40 mg at 05/25/21 0552   piperacillin-tazobactam (ZOSYN) IVPB 3.375 g  3.375 g Intravenous Q8H Norins, Rosalyn Gess, MD   Stopped at 05/25/21 8119   remdesivir 100 mg in sodium chloride 0.9 % 100 mL IVPB  100 mg Intravenous Daily Derrek Gu, RPH 200 mL/hr at 05/25/21 1000 Infusion Verify at 05/25/21 1000   trihexyphenidyl (ARTANE) tablet 5 mg  5 mg Oral BID WC Norins, Rosalyn Gess, MD        Musculoskeletal: Strength & Muscle Tone: decreased Gait & Station: unable to stand Patient leans: N/A            Psychiatric Specialty Exam:  Presentation  General Appearance:  No data recorded Eye Contact: No data recorded Speech: No data recorded Speech Volume: No data recorded Handedness: No data recorded  Mood and Affect  Mood: No data recorded Affect: No data recorded  Thought Process  Thought Processes: No data recorded Descriptions of Associations:No data recorded Orientation:No data recorded Thought Content:No data recorded History of Schizophrenia/Schizoaffective disorder:No data recorded Duration of Psychotic Symptoms:No data recorded Hallucinations:No data recorded Ideas of Reference:No data recorded Suicidal Thoughts:No data recorded Homicidal Thoughts:No data recorded  Sensorium  Memory: No data recorded Judgment: No data recorded Insight: No data recorded  Executive Functions  Concentration: No data recorded Attention Span: No data recorded Recall: No data recorded Fund of Knowledge: No data recorded Language: No data recorded  Psychomotor Activity  Psychomotor Activity: No data recorded  Assets  Assets: No data recorded  Sleep  Sleep: No data recorded  Physical Exam: Physical Exam Vitals and nursing note reviewed.  Constitutional:      Appearance: Normal appearance.  HENT:     Head:  Normocephalic and atraumatic.     Mouth/Throat:     Pharynx: Oropharynx is clear.  Eyes:     Pupils: Pupils are equal, round, and reactive to light.  Cardiovascular:     Rate and Rhythm: Normal rate and regular rhythm.  Pulmonary:     Effort: Pulmonary effort is normal.     Breath sounds: Normal breath sounds.  Abdominal:     General: Abdomen is flat.     Palpations: Abdomen is soft.  Musculoskeletal:        General: Normal range of motion.  Skin:    General: Skin is warm and dry.  Neurological:     General: No focal deficit present.     Mental Status: He is alert. Mental status is at baseline.  Psychiatric:        Attention and Perception: He is inattentive.        Mood and Affect: Affect is blunt.        Speech: He is noncommunicative.  Behavior: Behavior is slowed.        Cognition and Memory: Cognition is impaired.   Review of Systems  Unable to perform ROS: Medical condition  Blood pressure 129/89, pulse 69, temperature (!) 96.1 F (35.6 C), resp. rate 19, height  (1.702 m), weight 69.3 kg, SpO2 100 %. Body mass index is 23.93 kg/m.  Treatment Plan Summary: Medication management and Plan 80 year old man acutely sick.  I am going to call this an acute delirium with catatonic behavior superimposed in a patient with chronic schizophrenia and dementia.  Given that his usual outpatient psychiatric medicines are all things that can only be given orally are choices to either discontinue them for now or try replacing it with a substitute.  Because he is not currently displaying agitation I think the safer choice would be to discontinue his medicines while continuing to observe him day by day.  I am going to stop the orders in place for his oral psychiatric medicines now and we will continue to follow up daily.  Disposition:  See note  Mordecai Rasmussen, MD 05/25/2021 12:57 PM
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
## 2021-05-26 DIAGNOSIS — R410 Disorientation, unspecified: Secondary | ICD-10-CM | POA: Diagnosis not present

## 2021-05-26 LAB — BASIC METABOLIC PANEL
Anion gap: 7 (ref 5–15)
BUN: 27 mg/dL — ABNORMAL HIGH (ref 8–23)
CO2: 26 mmol/L (ref 22–32)
Calcium: 8.6 mg/dL — ABNORMAL LOW (ref 8.9–10.3)
Chloride: 108 mmol/L (ref 98–111)
Creatinine, Ser: 1.45 mg/dL — ABNORMAL HIGH (ref 0.61–1.24)
GFR, Estimated: 49 mL/min — ABNORMAL LOW (ref >60)
Glucose, Bld: 163 mg/dL — ABNORMAL HIGH (ref 70–99)
Potassium: 3.7 mmol/L (ref 3.5–5.1)
Sodium: 141 mmol/L (ref 135–145)

## 2021-05-26 LAB — MAGNESIUM: Magnesium: 2.4 mg/dL (ref 1.7–2.4)

## 2021-05-26 LAB — GLUCOSE, CAPILLARY
Glucose-Capillary: 117 mg/dL — ABNORMAL HIGH (ref 70–99)
Glucose-Capillary: 134 mg/dL — ABNORMAL HIGH (ref 70–99)
Glucose-Capillary: 138 mg/dL — ABNORMAL HIGH (ref 70–99)
Glucose-Capillary: 148 mg/dL — ABNORMAL HIGH (ref 70–99)
Glucose-Capillary: 152 mg/dL — ABNORMAL HIGH (ref 70–99)
Glucose-Capillary: 159 mg/dL — ABNORMAL HIGH (ref 70–99)

## 2021-05-26 LAB — CBC
HCT: 29.3 % — ABNORMAL LOW (ref 39.0–52.0)
Hemoglobin: 9.8 g/dL — ABNORMAL LOW (ref 13.0–17.0)
MCH: 31.7 pg (ref 26.0–34.0)
MCHC: 33.4 g/dL (ref 30.0–36.0)
MCV: 94.8 fL (ref 80.0–100.0)
Platelets: 87 10*3/uL — ABNORMAL LOW (ref 150–400)
RBC: 3.09 MIL/uL — ABNORMAL LOW (ref 4.22–5.81)
RDW: 16.3 % — ABNORMAL HIGH (ref 11.5–15.5)
WBC: 5 10*3/uL (ref 4.0–10.5)
nRBC: 1.6 % — ABNORMAL HIGH (ref 0.0–0.2)

## 2021-05-26 LAB — D-DIMER, QUANTITATIVE: D-Dimer, Quant: 2.19 ug/mL-FEU — ABNORMAL HIGH (ref 0.00–0.50)

## 2021-05-26 LAB — FERRITIN: Ferritin: 158 ng/mL (ref 24–336)

## 2021-05-26 LAB — AMMONIA: Ammonia: <10 umol/L (ref 9–35)

## 2021-05-26 LAB — LACTATE DEHYDROGENASE: LDH: 226 U/L — ABNORMAL HIGH (ref 98–192)

## 2021-05-26 MED ORDER — LACTATED RINGERS IV SOLN: INTRAVENOUS | Status: DC

## 2021-05-26 MED ORDER — DEXTROSE 5 % IV SOLN
10.0000 mg/kg | Freq: Two times a day (BID) | INTRAVENOUS | Status: DC
  Filled 2021-05-26 (×2): qty 13.9
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
## 2021-05-26 NOTE — Progress Notes (Signed)
SLP Cancellation Note  Patient Details Name: Terrence Johnson MRN: [id number] DOB: Aug 02, 1941   Cancelled treatment:        Chart reviewed. Discussion with Nsg re: current status. No change since bedside swallow eval Monday. Pt is not safe to take any PO's at this time. Still with hypothermia. ST will continue to follow in hopes of improvement and ability to take PO's. Alternative means of nutrition may need to be considered. Nsg to continue with good oral Hygeine. ST to follow up Thursday.   Eather Colas 05/26/2021, 1:25 PM
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
## 2021-05-26 NOTE — Consult Note (Signed)
Neurology Consultation  Reason for Consult: Altered mental status Referring Physician: Dr. Karna Christmas  CC: Altered mental status  History is obtained from: Chart  HPI: Terrence Johnson is a 80 y.o. male past medical history of dementia, cognitive impairment, COPD, diabetes, hypercholesterolemia, schizophrenia, presented with altered mental status, bradycardia and hypothermia to the emergency room on 05/23/2020 from a facility.  At baseline apparently the patient is awake alert and able to have normal communication but it was noted that he had slurred speech and was having difficulty ambulating and not following commands.  EMS was called.  They noted him to be bradycardic and brought in for further evaluation.  While in the hospital, he was noted to be hypotensive and more bradycardic for which he was moved to the ICU for further management. Is noted to be positive for COVID-19 infection.  Chest x-ray with no evidence of pneumonia.  Also reportedly had been falling prior to presentation with multiple falls. Was evaluated by the ICU team, is being treated for symptomatic bradycardia in the setting of first-degree heart block and QT prolongation, possible circulatory shock in the setting of infection and demand ischemia, encephalopathy-multifactorial, also thrombocytopenia that was noted. Neurological consultation was obtained for him being obtunded and far from his baseline as described by family to the primary team. I have not been able to speak with the family members as they were not there at the bedside during the time of this encounter.  Patient is unable to provide any meaningful history at this time   LKW: Prior to presentation for admission-sometime on 05/23/2021 tpa given?: no, not evaluated as a code stroke-came in for altered mental status Premorbid modified Rankin scale (mRS): Unclear  ROS: Unable to obtain due to altered mental status.   Past Medical History:  Diagnosis Date   Cognitive  impairment    COPD (chronic obstructive pulmonary disease) (HCC)    Diabetes mellitus without complication (HCC)    Hypercholesteremia    Hypertension    Psychotic disorder (HCC)         Family History  Problem Relation Age of Onset   Hypertension Mother      Social History:   reports that he has been smoking. He has never used smokeless tobacco. He reports that he does not drink alcohol and does not use drugs.  Medications  Current Facility-Administered Medications:    0.9 %  sodium chloride infusion, 250 mL, Intravenous, Continuous, Rust-Chester, Cecelia Byars, NP, Stopped at 05/24/21 0753   acetaminophen (TYLENOL) tablet 650 mg, 650 mg, Oral, Q6H PRN **OR** acetaminophen (TYLENOL) suppository 650 mg, 650 mg, Rectal, Q6H PRN, Norins, Rosalyn Gess, MD   aspirin EC tablet 81 mg, 81 mg, Oral, Daily, Norins, Rosalyn Gess, MD   atorvastatin (LIPITOR) tablet 40 mg, 40 mg, Oral, QHS, Norins, Rosalyn Gess, MD   atropine 1 MG/10ML injection 0.5 mg, 0.5 mg, Intravenous, Once PRN, Rust-Chester, Micheline Rough L, NP   chlorhexidine (PERIDEX) 0.12 % solution 15 mL, 15 mL, Mouth Rinse, BID, Aleskerov, Fuad, MD, 15 mL at 05/25/21 2155   Chlorhexidine Gluconate Cloth 2 % PADS 6 each, 6 each, Topical, Q0600, Norins, Rosalyn Gess, MD, 6 each at 05/26/21 0513   clopidogrel (PLAVIX) tablet 75 mg, 75 mg, Oral, Daily, Norins, Rosalyn Gess, MD   hydrocortisone sodium succinate (SOLU-CORTEF) 100 MG injection 50 mg, 50 mg, Intravenous, Q12H, Graves, Dana E, NP, 50 mg at 05/26/21 0513   Ipratropium-Albuterol (COMBIVENT) respimat 1 puff, 1 puff, Inhalation, BID, Norins, Rosalyn Gess, MD, 1  puff at 05/25/21 2156   lactated ringers infusion, , Intravenous, Continuous, Vida Rigger, MD   MEDLINE mouth rinse, 15 mL, Mouth Rinse, q12n4p, Karna Christmas, Fuad, MD, 15 mL at 05/25/21 1616   mupirocin ointment (BACTROBAN) 2 % 1 application, 1 application, Nasal, BID, Norins, Rosalyn Gess, MD, 1 application at 05/25/21 2156   nicotine (NICODERM CQ  - dosed in mg/24 hours) patch 21 mg, 21 mg, Transdermal, Daily, Norins, Rosalyn Gess, MD, 21 mg at 05/26/21 1008   pantoprazole (PROTONIX) injection 40 mg, 40 mg, Intravenous, Q24H, Rust-Chester, Britton L, NP, 40 mg at 05/26/21 0513   piperacillin-tazobactam (ZOSYN) IVPB 3.375 g, 3.375 g, Intravenous, Q8H, Norins, Rosalyn Gess, MD, Last Rate: 12.5 mL/hr at 05/26/21 0514, 3.375 g at 05/26/21 0514   [COMPLETED] remdesivir 200 mg in sodium chloride 0.9% 250 mL IVPB, 200 mg, Intravenous, Once, Stopped at 05/23/21 1653 **FOLLOWED BY** remdesivir 100 mg in sodium chloride 0.9 % 100 mL IVPB, 100 mg, Intravenous, Daily, Derrek Gu, RPH, Last Rate: 200 mL/hr at 05/26/21 1005, 100 mg at 05/26/21 1005   Exam: Current vital signs: BP (!) 167/69   Pulse (!) 53   Temp (!) 93.6 F (34.2 C) Comment: provider aware  Resp 14   Ht 5\' 7"  (1.702 m)   Wt 69.3 kg   SpO2 100%   BMI 23.93 kg/m  Vital signs in last 24 hours: Temp:  [93.6 F (34.2 C)-99 F (37.2 C)] 93.6 F (34.2 C) (10/05 1200) Pulse Rate:  [36-88] 53 (10/05 1200) Resp:  [12-23] 14 (10/05 1200) BP: (94-173)/(52-84) 167/69 (10/05 1200) SpO2:  [84 %-100 %] 100 % (10/05 1200)  General: Drowsy, comparably laying in bed in no acute distress HEENT: Normocephalic/atraumatic, edentulous CVS: Mildly bradycardic, regular Respiratory: Breathing well saturating normally on room air Abdomen nondistended nontender Neurological exam Drowsy, opens eyes to voice Extremely poor concentration, minimal verbal output that is incomprehensible for the most part. Follow simple commands Cranial nerves: Pupils equal round react light, extraocular movements intact, visual fields full to threat, facial symmetry difficult to ascertain due to edentulousness. Motor exam: Able to raise both upper extremities against gravity without drift.  Unable to raise both lower extremities against gravity but was able to withdraw strongly to noxious stimulation Sensory exam as  above Coordination difficult to assess given his mentation   Labs I have reviewed labs in epic and the results pertinent to this consultation are:  CBC    Component Value Date/Time   WBC 5.0 05/26/2021 0514   RBC 3.09 (L) 05/26/2021 0514   HGB 9.8 (L) 05/26/2021 0514   HCT 29.3 (L) 05/26/2021 0514   PLT 87 (L) 05/26/2021 0514   MCV 94.8 05/26/2021 0514   MCH 31.7 05/26/2021 0514   MCHC 33.4 05/26/2021 0514   RDW 16.3 (H) 05/26/2021 0514   LYMPHSABS 0.2 (L) 05/23/2021 2303   MONOABS 0.7 05/23/2021 2303   EOSABS 0.0 05/23/2021 2303   BASOSABS 0.0 05/23/2021 2303    CMP     Component Value Date/Time   NA 141 05/26/2021 0514   K 3.7 05/26/2021 0514   CL 108 05/26/2021 0514   CO2 26 05/26/2021 0514   GLUCOSE 163 (H) 05/26/2021 0514   BUN 27 (H) 05/26/2021 0514   CREATININE 1.45 (H) 05/26/2021 0514   CALCIUM 8.6 (L) 05/26/2021 0514   PROT 4.9 (L) 05/23/2021 2303   ALBUMIN 2.4 (L) 05/23/2021 2303   AST 41 05/23/2021 2303   ALT 54 (H) 05/23/2021 2303   ALKPHOS  64 05/23/2021 2303   BILITOT 0.7 05/23/2021 2303   GFRNONAA 49 (L) 05/26/2021 0514   GFRAA >60 02/19/2017 0506    Lipid Panel     Component Value Date/Time   CHOL 119 12/16/2020 0622   TRIG 42 12/16/2020 0622   HDL 40 (L) 12/16/2020 0622   CHOLHDL 3.0 12/16/2020 0622   VLDL 8 12/16/2020 0622   LDLCALC 71 12/16/2020 0622     Imaging I have reviewed the images obtained  CT-head-on arrival unremarkable for acute process  MRI examination of the brain-we will attempt tomorrow-with and without contrast once renal function shows some improvement  Assessment:  80 year old with extensive psychiatric history amongst others presenting for evaluation of altered mental status and found to be positive for COVID-19 infection.  No lung abnormalities on imaging. Also symptomatic bradycardia noted on arrival to the emergency room and is being evaluated by the PCCM team. Remains encephalopathic way out of proportion to  his baseline-which I think is multifactorial given Somewhat deranged function, bradycardia, as well as COVID-19 infection although he does not seem to have a lot of respiratory findings. In addition, he has been evaluated by psychiatry, who have opined that he has acute delirium with catatonic behavior superimposed in a patient with chronic schizophrenia and dementia-which might be the unifying diagnosis in his case. I do not suspect an underlying CNS infection at this time-a bacterial infection would have been fatal by this time and I do not think with his thrombocytopenia it would be safe to perform an LP at this time-hence MRI scan should be performed to look for any evidence of meningeal encephalitis although suspicion is low.  Impression: Most likely toxic metabolic encephalopathy/acute delirium with catatonic behavior superimposed in the patient with chronic schizophrenia and dementia now acutely sick with COVID-19 infection, bradycardia and deranged renal function.  Recommendations: At this time, I think his encephalopathy is multifactorial given the acute issues that he is having which are being addressed by the PCCM team. I do not think that he has a CNS bacterial infection. I would also be hesitant to start him on HSV encephalitis coverage without compelling evidence or do a spinal tap given his thrombocytopenia-hence an MRI of the brain with and without contrast should be attempted once renal function improves some. Plan on MRI tomorrow. Supportive care per PCCM as you are No need for meningitic or encephalitic coverage at this time.   -- Milon Dikes, MD Neurologist [facility] Pager: [phone]   CRITICAL CARE ATTESTATION Performed by: Milon Dikes, MD Total critical care time: 45 minutes Critical care time was exclusive of separately billable procedures and treating other patients and/or supervising APPs/Residents/Students Critical care was necessary to treat or  prevent imminent or life-threatening deterioration due to toxic metabolic encephalopathy This patient is critically ill and at significant risk for neurological worsening and/or death and care requires constant monitoring. Critical care was time spent personally by me on the following activities: development of treatment plan with patient and/or surrogate as well as nursing, discussions with consultants, evaluation of patient's response to treatment, examination of patient, obtaining history from patient or surrogate, ordering and performing treatments and interventions, ordering and review of laboratory studies, ordering and review of radiographic studies, pulse oximetry, re-evaluation of patient's condition, participation in multidisciplinary rounds and medical decision making of high complexity in the care of this patient.

## 2021-05-27 DIAGNOSIS — R410 Disorientation, unspecified: Secondary | ICD-10-CM | POA: Diagnosis not present

## 2021-05-27 LAB — MAGNESIUM: Magnesium: 2.1 mg/dL (ref 1.7–2.4)

## 2021-05-27 LAB — CBC WITH DIFFERENTIAL/PLATELET
Abs Immature Granulocytes: 0.06 10*3/uL (ref 0.00–0.07)
Basophils Absolute: 0 10*3/uL (ref 0.0–0.1)
Basophils Relative: 0 %
Eosinophils Absolute: 0 10*3/uL (ref 0.0–0.5)
Eosinophils Relative: 0 %
HCT: 30.5 % — ABNORMAL LOW (ref 39.0–52.0)
Hemoglobin: 10.1 g/dL — ABNORMAL LOW (ref 13.0–17.0)
Immature Granulocytes: 1 %
Lymphocytes Relative: 20 %
Lymphs Abs: 0.9 10*3/uL (ref 0.7–4.0)
MCH: 31.9 pg (ref 26.0–34.0)
MCHC: 33.1 g/dL (ref 30.0–36.0)
MCV: 96.2 fL (ref 80.0–100.0)
Monocytes Absolute: 0.5 10*3/uL (ref 0.1–1.0)
Monocytes Relative: 11 %
Neutro Abs: 3.1 10*3/uL (ref 1.7–7.7)
Neutrophils Relative %: 68 %
Platelets: 91 10*3/uL — ABNORMAL LOW (ref 150–400)
RBC: 3.17 MIL/uL — ABNORMAL LOW (ref 4.22–5.81)
RDW: 16.1 % — ABNORMAL HIGH (ref 11.5–15.5)
WBC: 4.6 10*3/uL (ref 4.0–10.5)
nRBC: 1.5 % — ABNORMAL HIGH (ref 0.0–0.2)

## 2021-05-27 LAB — BASIC METABOLIC PANEL
Anion gap: 6 (ref 5–15)
BUN: 29 mg/dL — ABNORMAL HIGH (ref 8–23)
CO2: 25 mmol/L (ref 22–32)
Calcium: 8.6 mg/dL — ABNORMAL LOW (ref 8.9–10.3)
Chloride: 113 mmol/L — ABNORMAL HIGH (ref 98–111)
Creatinine, Ser: 1.41 mg/dL — ABNORMAL HIGH (ref 0.61–1.24)
GFR, Estimated: 51 mL/min — ABNORMAL LOW (ref >60)
Glucose, Bld: 96 mg/dL (ref 70–99)
Potassium: 3.6 mmol/L (ref 3.5–5.1)
Sodium: 144 mmol/L (ref 135–145)

## 2021-05-27 LAB — GLUCOSE, CAPILLARY
Glucose-Capillary: 100 mg/dL — ABNORMAL HIGH (ref 70–99)
Glucose-Capillary: 101 mg/dL — ABNORMAL HIGH (ref 70–99)
Glucose-Capillary: 105 mg/dL — ABNORMAL HIGH (ref 70–99)
Glucose-Capillary: 112 mg/dL — ABNORMAL HIGH (ref 70–99)
Glucose-Capillary: 114 mg/dL — ABNORMAL HIGH (ref 70–99)

## 2021-05-27 LAB — PHOSPHORUS: Phosphorus: 3.6 mg/dL (ref 2.5–4.6)

## 2021-05-27 NOTE — Progress Notes (Signed)
Patient ID: [name], male   DOB: Jun 08, 1941, 80 y.o.   MRN: [id number]    Progress Note from the Palliative Medicine Team at [facility]   Patient Name: [name]        Date: 05/27/2021 DOB: [birth year]  Age: 80 y.o. MRN#: [id number] Attending Physician: Vida Rigger, MD Primary Care Physician: Patient, No Pcp Per (Inactive) Admit Date: 05/23/2021   Medical records reviewed   80 y.o. male   admitted on 05/23/2021 with past medical history significant of schizophrenia, diabetes, cognitive impairment, dementia, COPD who presents with altered mental status.    Remain in ICU for encephalopathy, poor po intake,  Covid positive. Critically ill.  This NP spoke to bedside RN for update.  Spoke to brother/Archie by phone.  He is getting frequent updates from CCM, he understands the likely [place] term poor prognosis.   Plan is to continue with current treatment plan "for a few more days" if patient does not show signs of improvement, decision will be to shift to comfort and allow a natural death. Comfort is a priority.  Education offered on natural trajectory and expectations at EOL and hospice care  Questions and concerns addressed   Discussed with bedside RN   Total time spent on the unit was 20 minutes  Greater than 50% of the time was spent in counseling and coordination of care  Lorinda Creed NP  Palliative Medicine Team Team Phone # [phone] Pager [phone]
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
## 2021-05-28 ENCOUNTER — Inpatient Hospital Stay: Payer: Medicare Other

## 2021-05-28 LAB — CULTURE, BLOOD (ROUTINE X 2)
Culture: NO GROWTH
Culture: NO GROWTH
Special Requests: ADEQUATE

## 2021-05-28 LAB — BASIC METABOLIC PANEL
Anion gap: 9 (ref 5–15)
BUN: 30 mg/dL — ABNORMAL HIGH (ref 8–23)
CO2: 24 mmol/L (ref 22–32)
Calcium: 8.9 mg/dL (ref 8.9–10.3)
Chloride: 109 mmol/L (ref 98–111)
Creatinine, Ser: 1.36 mg/dL — ABNORMAL HIGH (ref 0.61–1.24)
GFR, Estimated: 53 mL/min — ABNORMAL LOW (ref >60)
Glucose, Bld: 102 mg/dL — ABNORMAL HIGH (ref 70–99)
Potassium: 3.5 mmol/L (ref 3.5–5.1)
Sodium: 142 mmol/L (ref 135–145)

## 2021-05-28 LAB — CBC
HCT: 29.2 % — ABNORMAL LOW (ref 39.0–52.0)
Hemoglobin: 9.8 g/dL — ABNORMAL LOW (ref 13.0–17.0)
MCH: 32.2 pg (ref 26.0–34.0)
MCHC: 33.6 g/dL (ref 30.0–36.0)
MCV: 96.1 fL (ref 80.0–100.0)
Platelets: 107 10*3/uL — ABNORMAL LOW (ref 150–400)
RBC: 3.04 MIL/uL — ABNORMAL LOW (ref 4.22–5.81)
RDW: 16.2 % — ABNORMAL HIGH (ref 11.5–15.5)
WBC: 6.2 10*3/uL (ref 4.0–10.5)
nRBC: 1.1 % — ABNORMAL HIGH (ref 0.0–0.2)

## 2021-05-28 LAB — GLUCOSE, CAPILLARY
Glucose-Capillary: 91 mg/dL (ref 70–99)
Glucose-Capillary: 99 mg/dL (ref 70–99)
Glucose-Capillary: 101 mg/dL — ABNORMAL HIGH (ref 70–99)
Glucose-Capillary: 102 mg/dL — ABNORMAL HIGH (ref 70–99)
Glucose-Capillary: 108 mg/dL — ABNORMAL HIGH (ref 70–99)

## 2021-05-28 LAB — MAGNESIUM: Magnesium: 1.9 mg/dL (ref 1.7–2.4)

## 2021-05-28 LAB — PHOSPHORUS: Phosphorus: 3.5 mg/dL (ref 2.5–4.6)

## 2021-05-28 MED ORDER — LORAZEPAM 2 MG/ML IJ SOLN
INTRAMUSCULAR | Status: AC
  Filled 2021-05-28: qty 1

## 2021-05-28 MED ORDER — GADOBUTROL 1 MMOL/ML IV SOLN
7.0000 mL | Freq: Once | INTRAVENOUS | Status: AC | PRN
  Administered 2021-05-28: 7 mL via INTRAVENOUS

## 2021-05-28 MED ORDER — LORAZEPAM 2 MG/ML IJ SOLN: 1.0000 mg | Freq: Once | INTRAMUSCULAR | Status: DC
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
## 2021-05-28 NOTE — Consult Note (Signed)
  Psychiatry: Just a brief note.  Came by the ICU to see the patient but was advised that he was sleeping.  Spoke with his nurse.  She reports that he is showing gradual improvement.  He can give more lucid answers to questions at times.  Remains cooperative with treatment.  Physically seems to be improving a little although still requiring the heating blanket.  Medical condition appears to be improving.  I would continue as of today and the recommendation I had not to restart schizophrenia medicines while he is still in this medical condition.  I will continue to follow and see him over the weekend.

## 2021-05-28 NOTE — Progress Notes (Signed)
SLP Cancellation Note  Patient Details Name: Christophr Calix MRN: [id number] DOB: 09-14-1940   Cancelled treatment:       Reason Eval/Treat Not Completed: Patient at procedure or test/unavailable (out of room). Will f/u w/ pt tomorrow.     Jerilynn Som, MS, CCC-SLP Speech Language Pathologist Rehab Services [phone] [facility] 05/28/2021, 3:03 PM

## 2021-05-29 ENCOUNTER — Encounter: Payer: Self-pay | Admitting: Internal Medicine

## 2021-05-29 ENCOUNTER — Inpatient Hospital Stay: Payer: Medicare Other

## 2021-05-29 ENCOUNTER — Inpatient Hospital Stay: Payer: Self-pay

## 2021-05-29 LAB — GLUCOSE, CAPILLARY
Glucose-Capillary: 104 mg/dL — ABNORMAL HIGH (ref 70–99)
Glucose-Capillary: 62 mg/dL — ABNORMAL LOW (ref 70–99)
Glucose-Capillary: 75 mg/dL (ref 70–99)
Glucose-Capillary: 78 mg/dL (ref 70–99)
Glucose-Capillary: 80 mg/dL (ref 70–99)
Glucose-Capillary: 93 mg/dL (ref 70–99)
Glucose-Capillary: 96 mg/dL (ref 70–99)
Glucose-Capillary: 97 mg/dL (ref 70–99)

## 2021-05-29 LAB — CBC
HCT: 28.2 % — ABNORMAL LOW (ref 39.0–52.0)
Hemoglobin: 9.4 g/dL — ABNORMAL LOW (ref 13.0–17.0)
MCH: 31.3 pg (ref 26.0–34.0)
MCHC: 33.3 g/dL (ref 30.0–36.0)
MCV: 94 fL (ref 80.0–100.0)
Platelets: 104 10*3/uL — ABNORMAL LOW (ref 150–400)
RBC: 3 MIL/uL — ABNORMAL LOW (ref 4.22–5.81)
RDW: 15.9 % — ABNORMAL HIGH (ref 11.5–15.5)
WBC: 6.1 10*3/uL (ref 4.0–10.5)
nRBC: 1.1 % — ABNORMAL HIGH (ref 0.0–0.2)

## 2021-05-29 LAB — MAGNESIUM: Magnesium: 1.8 mg/dL (ref 1.7–2.4)

## 2021-05-29 LAB — HEPATITIS C ANTIBODY: HCV Ab: <0.1 s/co ratio — AB (ref 0.0–0.9)

## 2021-05-29 LAB — BASIC METABOLIC PANEL
Anion gap: 9 (ref 5–15)
BUN: 25 mg/dL — ABNORMAL HIGH (ref 8–23)
CO2: 28 mmol/L (ref 22–32)
Calcium: 8.7 mg/dL — ABNORMAL LOW (ref 8.9–10.3)
Chloride: 103 mmol/L (ref 98–111)
Creatinine, Ser: 1.26 mg/dL — ABNORMAL HIGH (ref 0.61–1.24)
GFR, Estimated: 58 mL/min — ABNORMAL LOW (ref >60)
Glucose, Bld: 104 mg/dL — ABNORMAL HIGH (ref 70–99)
Potassium: 2.9 mmol/L — ABNORMAL LOW (ref 3.5–5.1)
Sodium: 140 mmol/L (ref 135–145)

## 2021-05-29 LAB — PHOSPHORUS: Phosphorus: 3 mg/dL (ref 2.5–4.6)

## 2021-05-29 MED ORDER — POTASSIUM CHLORIDE 10 MEQ/100ML IV SOLN
10.0000 meq | INTRAVENOUS | Status: DC
  Filled 2021-05-29 (×6): qty 100

## 2021-05-29 MED ORDER — AMLODIPINE BESYLATE 5 MG PO TABS
5.0000 mg | ORAL_TABLET | Freq: Every day | ORAL | Status: DC
  Administered 2021-05-29 – 2021-05-31 (×3): 5 mg
  Filled 2021-05-29 (×3): qty 1

## 2021-05-29 MED ORDER — HYDRALAZINE HCL 20 MG/ML IJ SOLN
INTRAMUSCULAR | Status: AC
  Administered 2021-05-29: 20 mg via INTRAVENOUS
  Filled 2021-05-29: qty 1

## 2021-05-29 MED ORDER — HYDRALAZINE HCL 20 MG/ML IJ SOLN: 10.0000 mg | INTRAMUSCULAR | Status: DC | PRN

## 2021-05-29 MED ORDER — CHLORHEXIDINE GLUCONATE CLOTH 2 % EX PADS
6.0000 | MEDICATED_PAD | Freq: Every day | CUTANEOUS | Status: DC
  Administered 2021-05-29 – 2021-06-01 (×4): 6 via TOPICAL

## 2021-05-29 MED ORDER — POTASSIUM CHLORIDE 20 MEQ PO PACK
40.0000 meq | PACK | Freq: Once | ORAL | Status: AC
  Administered 2021-05-30: 40 meq
  Filled 2021-05-29: qty 2

## 2021-05-29 MED ORDER — ATORVASTATIN CALCIUM 20 MG PO TABS
40.0000 mg | ORAL_TABLET | Freq: Every day | ORAL | Status: DC
  Administered 2021-05-29 – 2021-05-30 (×2): 40 mg
  Filled 2021-05-29 (×2): qty 2

## 2021-05-29 MED ORDER — DEXTROSE 50 % IV SOLN: 12.5000 g | INTRAVENOUS | Status: AC

## 2021-05-29 MED ORDER — PANTOPRAZOLE SODIUM 40 MG PO TBEC
40.0000 mg | DELAYED_RELEASE_TABLET | Freq: Every day | ORAL | Status: DC
  Administered 2021-05-30: 40 mg via ORAL
  Filled 2021-05-29: qty 1

## 2021-05-29 MED ORDER — CLOPIDOGREL BISULFATE 75 MG PO TABS
75.0000 mg | ORAL_TABLET | Freq: Every day | ORAL | Status: DC
  Administered 2021-05-30 – 2021-06-01 (×3): 75 mg
  Filled 2021-05-29 (×4): qty 1

## 2021-05-29 MED ORDER — DEXTROSE IN LACTATED RINGERS 5 % IV SOLN: INTRAVENOUS | Status: DC

## 2021-05-29 MED ORDER — POTASSIUM CHLORIDE 20 MEQ PO PACK
40.0000 meq | PACK | Freq: Once | ORAL | Status: AC
  Administered 2021-05-29: 40 meq
  Filled 2021-05-29: qty 2

## 2021-05-29 MED ORDER — MAGNESIUM SULFATE 2 GM/50ML IV SOLN
2.0000 g | Freq: Once | INTRAVENOUS | Status: DC
  Filled 2021-05-29: qty 50

## 2021-05-29 MED ORDER — DEXTROSE 50 % IV SOLN
INTRAVENOUS | Status: AC
  Administered 2021-05-29: 12.5 g via INTRAVENOUS
  Filled 2021-05-29: qty 50

## 2021-05-29 NOTE — Plan of Care (Signed)
MRI brain with and without contrast negative for acute process. Clinically slow improvement per primary team. No further neurological work up indicated at this time. Excellent medical management per primary team as you are. Appreciate psyschiatry consultation for management of his medications. Neurology inpatient will be available as needed. D/W Dr. Karna Christmas.  -- Milon Dikes, MD Neurologist [facility] Pager: [phone]

## 2021-05-29 NOTE — Progress Notes (Signed)
Received VAST consult to restart PIV. Assessed bilateral arms with ultrasound. Noted small veins and bilateral 3+edema.Attempted x2. Pierced veins but unable to thread catheter. Notified primary RN Perlie Gold.

## 2021-05-29 NOTE — Progress Notes (Addendum)
Medical Necessity  Emergent PICC ordered for patient after IV access lost.  IV team unable to place peripheral IV. PICC needed emergently as patient remains too lethargic for SLP eval and is receiving all medication via IV administration. - administration of hyperosmolar/irritating solutions - Prolonged IV therapies  Addendum: - Informed by nursing that PICC team is unable to come emergently and will arrive tomorrow morning.  - Attempted x 2 to reach the patient's decision maker and brother, Archie Dickerman, unable to leave a voicemail. - NGT ordered for patient to receive potassium replacement & anti-hypertensive's. Will continue to attempt to update the patient's brother.   Cheryll Cockayne Rust-Chester, AGACNP-BC Acute Care Nurse Practitioner [facility]   [phone] / [phone] Please see Amion for pager details.
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
## 2021-05-29 NOTE — Progress Notes (Signed)
Secure chat conversation with Michaell Cowing.  Notified that PICC would be placed in the morning. RN to obtain consent/ medical necessity.

## 2021-05-29 NOTE — Progress Notes (Signed)
After administration of dextrose 50% for hypoglycemic event, pt inadvertently dislodged IV on right arm.  Checking the other sites revealed them to be dislodged as well.  Attempted to gain new IV access, unsuccessful attempt x1 to left forearm.  Pts arms overly edematous, possible need for US guided placement.  IV team consult placed.

## 2021-05-29 NOTE — Progress Notes (Signed)
Speech Language Pathology Treatment: Dysphagia  Patient Details Name: Terrence Johnson MRN: [id number] DOB: 1941/07/28 Today's Date: 05/29/2021 Time: 6270-3500 SLP Time Calculation (min) (ACUTE ONLY): 25 min  Assessment / Plan / Recommendation Clinical Impression  Pt was seen for ongoing assessment of safe return to PO intake. Pt was alert throughout, oriented to himself, made several basic responses to SLP. SLP repositioned pt in bed but pt remained restless throughout session. Skilled observation was provided of pt consuming ice chips, thin water via straw and 2 boluses of applesauce. Pt demonstrated appropriate oral response (closed his lips, contained boluses in his mouth) and his swallow appeared swift with ice chips and single sips of water via straw. Pt was not able to phonate on demand so vocal quality was difficult to appreciate, vitals remained WNL. When consuming a bolus of applesauce, pt appeared to struggle with coordinating movement of bolus as what is likely pharyngeal gurgling was heard on inspiration with immediate intense coughing. RR increased. In light of the fact that pt has been NPO for 6 days, a second bolus of applesauce was administered with same result. At this time, pt is experiencing a severe oral phase dysphagia that is likely multi-factorial. Recommend ice chips and sips of water via straw at bedside to preserve swallow musculature. Recommend continued follow up with Palliative Care as pt's prognosis for PO intake is considered very poor at this time.   Attending (Dr Karna Christmas) is aware of prognosis and current recommendations.    HPI HPI: Per admitting H&P "Terrence Johnson is a 80 y.o. male with medical history significant of schizophrenia, diabetes, cognitive impairment, dementia, COPD who presents with altered mental status.  History is obtained from EMS as the patient is unable to provide a history due to altered mental status.  Apparently patient had a fall on Friday at his  facility, they noticed some slurred speech yesterday and he was having difficulty ambulating.  Today he was not able to follow commands and was not speaking.  With EMS he was bradycardic in the 30s and hypotensive 70s over 50s.  He received 0.5 mg of atropine and some fluid and blood pressure came up as did his heart rate."      SLP Plan  Continue with current plan of care      Recommendations for follow up therapy are one component of a multi-disciplinary discharge planning process, led by the attending physician.  Recommendations may be updated based on patient status, additional functional criteria and insurance authorization.    Recommendations  Diet recommendations:  (ice chips and sip so water) Liquids provided via: Straw;Teaspoon Medication Administration: Via alternative means Supervision: Staff to assist with self feeding Compensations: Minimize environmental distractions;Slow rate;Small sips/bites Postural Changes and/or Swallow Maneuvers: Seated upright 90 degrees                Oral Care Recommendations: Oral care QID Follow up Recommendations:  (Palliative Care) SLP Visit Diagnosis: Dysphagia, oral phase (R13.11);Dysphagia, oropharyngeal phase (R13.12) Plan: Continue with current plan of care       GO              Trude Cansler B. Dreama Saa M.S., CCC-SLP, [facility] Speech-Language Pathologist Rehabilitation Services Office [phone]   Reuel Derby  05/29/2021, 10:52 AM

## 2021-05-30 LAB — BASIC METABOLIC PANEL
Anion gap: 8 (ref 5–15)
BUN: 21 mg/dL (ref 8–23)
CO2: 29 mmol/L (ref 22–32)
Calcium: 8.9 mg/dL (ref 8.9–10.3)
Chloride: 106 mmol/L (ref 98–111)
Creatinine, Ser: 1.12 mg/dL (ref 0.61–1.24)
GFR, Estimated: >60 mL/min (ref >60)
Glucose, Bld: 95 mg/dL (ref 70–99)
Potassium: 3.5 mmol/L (ref 3.5–5.1)
Sodium: 143 mmol/L (ref 135–145)

## 2021-05-30 LAB — GLUCOSE, CAPILLARY
Glucose-Capillary: 102 mg/dL — ABNORMAL HIGH (ref 70–99)
Glucose-Capillary: 103 mg/dL — ABNORMAL HIGH (ref 70–99)
Glucose-Capillary: 67 mg/dL — ABNORMAL LOW (ref 70–99)
Glucose-Capillary: 71 mg/dL (ref 70–99)
Glucose-Capillary: 75 mg/dL (ref 70–99)
Glucose-Capillary: 79 mg/dL (ref 70–99)
Glucose-Capillary: 85 mg/dL (ref 70–99)
Glucose-Capillary: 86 mg/dL (ref 70–99)

## 2021-05-30 LAB — CBC
HCT: 32.4 % — ABNORMAL LOW (ref 39.0–52.0)
Hemoglobin: 10.8 g/dL — ABNORMAL LOW (ref 13.0–17.0)
MCH: 30.9 pg (ref 26.0–34.0)
MCHC: 33.3 g/dL (ref 30.0–36.0)
MCV: 92.8 fL (ref 80.0–100.0)
Platelets: 110 10*3/uL — ABNORMAL LOW (ref 150–400)
RBC: 3.49 MIL/uL — ABNORMAL LOW (ref 4.22–5.81)
RDW: 15.8 % — ABNORMAL HIGH (ref 11.5–15.5)
WBC: 8.5 10*3/uL (ref 4.0–10.5)
nRBC: 0.5 % — ABNORMAL HIGH (ref 0.0–0.2)

## 2021-05-30 LAB — MAGNESIUM: Magnesium: 1.8 mg/dL (ref 1.7–2.4)

## 2021-05-30 LAB — PHOSPHORUS: Phosphorus: 1.8 mg/dL — ABNORMAL LOW (ref 2.5–4.6)

## 2021-05-30 MED ORDER — ACETAMINOPHEN 325 MG PO TABS
650.0000 mg | ORAL_TABLET | Freq: Four times a day (QID) | ORAL | Status: DC | PRN
  Administered 2021-05-30 – 2021-05-31 (×2): 650 mg
  Filled 2021-05-30 (×2): qty 2

## 2021-05-30 MED ORDER — PALIPERIDONE ER 3 MG PO TB24: 9.0000 mg | ORAL_TABLET | Freq: Every day | ORAL | Status: DC

## 2021-05-30 MED ORDER — K PHOS MONO-SOD PHOS DI & MONO 155-852-130 MG PO TABS
1000.0000 mg | ORAL_TABLET | Freq: Once | ORAL | Status: AC
  Administered 2021-05-30: 1000 mg
  Filled 2021-05-30: qty 4

## 2021-05-30 MED ORDER — ASPIRIN 81 MG PO CHEW
81.0000 mg | CHEWABLE_TABLET | Freq: Every day | ORAL | Status: DC
  Administered 2021-05-30 – 2021-06-01 (×3): 81 mg
  Filled 2021-05-30 (×3): qty 1

## 2021-05-30 MED ORDER — PANTOPRAZOLE 2 MG/ML SUSPENSION
40.0000 mg | Freq: Every day | ORAL | Status: DC
  Administered 2021-05-31: 40 mg
  Filled 2021-05-30: qty 20

## 2021-05-30 MED ORDER — FOSFOMYCIN TROMETHAMINE 3 G PO PACK
3.0000 g | PACK | Freq: Once | ORAL | Status: AC
  Administered 2021-05-30: 3 g
  Filled 2021-05-30: qty 3

## 2021-05-30 MED ORDER — BENZTROPINE MESYLATE 1 MG PO TABS
1.0000 mg | ORAL_TABLET | Freq: Two times a day (BID) | ORAL | Status: DC
  Administered 2021-05-30 – 2021-05-31 (×4): 1 mg
  Filled 2021-05-30 (×7): qty 1

## 2021-05-30 MED ORDER — HYDRALAZINE HCL 10 MG PO TABS
10.0000 mg | ORAL_TABLET | Freq: Four times a day (QID) | ORAL | Status: DC
  Administered 2021-05-30 – 2021-06-01 (×10): 10 mg
  Filled 2021-05-30 (×14): qty 1

## 2021-05-30 MED ORDER — ACETAMINOPHEN 650 MG RE SUPP: 650.0000 mg | Freq: Four times a day (QID) | RECTAL | Status: DC | PRN
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
## 2021-05-30 NOTE — Progress Notes (Signed)
Hypoglycemic Event  CBG:  69  Treatment: 4 oz juice/soda via NG tube  Symptoms: None  Follow-up CBG: Time: 1625 CBG Result: 86  Possible Reasons for Event: Inadequate meal intake  Comments/MD notified: NPO post speech evaluation   Christell Constant, [facility]

## 2021-05-30 NOTE — Progress Notes (Signed)
fosfomycin (MONUROL) packet 3 g  ordered for patient by Cheryll Cockayne, NP for the pending urine specimen that most likey has bacteria.  This medication is in place of IV zosyn and can only be given every few days.  Nursing staff will monitor pending results to determine next steps.
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
## 2021-05-30 NOTE — Evaluation (Addendum)
Physical Therapy Evaluation Patient Details Name: Terrence Johnson MRN: [id number] DOB: Nov 30, 1940 Today's Date: 05/30/2021  History of Present Illness  80 year old male presenting to [facility] ED on 05/23/2021 via EMS from a group home with  altered mental status.  Apparently he had a fall on Friday at his facility and they noticed some slurred speech as well as difficulty ambulating.  Clinical Impression  Limited eval due to pt's mental status/agitation.  Per nursing and MD he is more awake and alert today than any since being admitted.  He apparently has some cognitive dysfunction at baseline, but very much struggled to hold conversation/answer questions meaningfully.  We did manage to get to sitting EOB and pt was ostensibly willing to try standing but something set him off and he became agitated, refusing anything and would not be persuaded otherwise. Pt's HR was elevated during session 100-110 at rest, increased to 120-130s during sitting (even reaching 150-170s but this was not sustained and was likely incorrect as PT palpated his pulse and it was not that elevated).  Will need OOB assessment when more willing and cogent.     Recommendations for follow up therapy are one component of a multi-disciplinary discharge planning process, led by the attending physician.  Recommendations may be updated based on patient status, additional functional criteria and insurance authorization.  Follow Up Recommendations SNF;Supervision/Assistance - 24 hour    Equipment Recommendations   (TBD)    Recommendations for Other Services       Precautions / Restrictions Precautions Precautions: Fall (Covid +) Restrictions Weight Bearing Restrictions: No      Mobility  Bed Mobility Overal bed mobility: Needs Assistance Bed Mobility: Supine to Sit;Sit to Supine     Supine to sit: Mod assist Sit to supine: Min assist   General bed mobility comments: Pt showed some initial effort in trying to use rails to get  to sitting, ultimately aborted effort and then needed considerable direct assist to get to sitting EOB.  Once he decided he was not going to participate he tried to lay himself back in bed, needed assist to get LEs back up into bed.    Transfers         General transfer comment: walker placed and pt prepped to attempt standing, pt suffently became agitated and refused further activity  Ambulation/Gait      Stairs      Wheelchair Mobility    Modified Rankin (Stroke Patients Only)       Balance Overall balance assessment: Needs assistance Sitting-balance support: Bilateral upper extremity supported Sitting balance-Leahy Scale: Fair Sitting balance - Comments: Once assisted to sitting he was able to maintain static sitting relatively well but cognition/impulsivity/agitation was a safety issue t/o sitting     Standing balance-Leahy Scale:  (pt refused)                               Pertinent Vitals/Pain Pain Assessment:  (may have reported some knee pain, unwilling to further discuss per agitation)    Home Living Family/patient expects to be discharged to:: Group home                      Prior Function           Comments: per previous documentation this year he is able to do mobility, ambulation, ADLs w/o AD and with only set up assist (water temp in the shower, etc)  Hand Dominance        Extremity/Trunk Assessment   Upper Extremity Assessment Upper Extremity Assessment: Generalized weakness    Lower Extremity Assessment Lower Extremity Assessment: Generalized weakness       Communication   Communication:  (Limited verbal communication with mostly short answers to questions only; little to no initiation of verbal communication until he became upset and repeated about how he wanted no more PT or any activity)  Cognition Arousal/Alertness: Awake/alert Behavior During Therapy: Agitated;Restless Overall Cognitive Status:  Impaired/Different from baseline (some baseline line cognitive issues, however apparently he has been essentially non-verbal in hospital until yesterday, still unable to hold appropriate conversation or answer orientation questions)                                        General Comments General comments (skin integrity, edema, etc.): Pt initially willing to participate, minimally verbal until he became agitated and made it very clear he was not going to be doing any more PT today    Exercises     Assessment/Plan    PT Assessment Patient needs continued PT services  PT Problem List Decreased strength;Decreased range of motion;Decreased activity tolerance;Decreased balance;Decreased mobility;Decreased coordination;Decreased knowledge of use of DME;Decreased safety awareness;Decreased cognition;Cardiopulmonary status limiting activity       PT Treatment Interventions DME instruction;Gait training;Functional mobility training;Therapeutic activities;Therapeutic exercise;Balance training;Neuromuscular re-education;Cognitive remediation;Patient/family education    PT Goals (Current goals can be found in the Care Plan section)  Acute Rehab PT Goals Patient Stated Goal: none stated PT Goal Formulation: Patient unable to participate in goal setting Time For Goal Achievement: 06/13/21 Potential to Achieve Goals: Fair    Frequency Min 2X/week   Barriers to discharge        Co-evaluation               AM-PAC PT "6 Clicks" Mobility  Outcome Measure Help needed turning from your back to your side while in a flat bed without using bedrails?: A Lot Help needed moving from lying on your back to sitting on the side of a flat bed without using bedrails?: A Lot Help needed moving to and from a bed to a chair (including a wheelchair)?: A Lot Help needed standing up from a chair using your arms (e.g., wheelchair or bedside chair)?: A Lot Help needed to walk in hospital room?:  A Lot Help needed climbing 3-5 steps with a railing? : Total 6 Click Score: 11    End of Session   Activity Tolerance: Treatment limited secondary to agitation Patient left: with call bell/phone within reach;in bed;with nursing/sitter in room Nurse Communication: Mobility status PT Visit Diagnosis: Muscle weakness (generalized) (M62.81);Unsteadiness on feet (R26.81);Difficulty in walking, not elsewhere classified (R26.2);History of falling (Z91.81)    Time: 0814-4818 PT Time Calculation (min) (ACUTE ONLY): 24 min   Charges:   PT Evaluation $PT Eval Low Complexity: 1 Low          Malachi Pro, DPT 05/30/2021, 5:40 PM

## 2021-05-31 DIAGNOSIS — N3000 Acute cystitis without hematuria: Secondary | ICD-10-CM | POA: Diagnosis not present

## 2021-05-31 LAB — GLUCOSE, CAPILLARY
Glucose-Capillary: 84 mg/dL (ref 70–99)
Glucose-Capillary: 78 mg/dL (ref 70–99)
Glucose-Capillary: 117 mg/dL — ABNORMAL HIGH (ref 70–99)
Glucose-Capillary: 140 mg/dL — ABNORMAL HIGH (ref 70–99)
Glucose-Capillary: 126 mg/dL — ABNORMAL HIGH (ref 70–99)
Glucose-Capillary: 141 mg/dL — ABNORMAL HIGH (ref 70–99)

## 2021-05-31 LAB — SUSCEPTIBILITY, AER + ANAEROB

## 2021-05-31 LAB — SUSCEPTIBILITY RESULT

## 2021-05-31 MED ORDER — AMLODIPINE BESYLATE 10 MG PO TABS
10.0000 mg | ORAL_TABLET | Freq: Every day | ORAL | Status: DC
  Administered 2021-06-01: 10 mg
  Filled 2021-05-31: qty 1

## 2021-05-31 NOTE — Progress Notes (Signed)
Pt remains on room air. Neuro status remains unchanged, Ox1. Foley remains in place, putting out adequate clear yellow urine. Meds being given via NG tube. Warming blanket placed d/t temp < 36. Turn q2. Pt in no acute distress @ this time. 1on1 sitter @ bedside. Will continue to monitor.   Transfer orders placed this evening.

## 2021-05-31 NOTE — Progress Notes (Signed)
Chart reviewed. Per Nsg, MD is trying to reach family to discuss options. Pt now has NGT in which he could receive nutrition through as a temporary means. ST discussed results of treatment session from Saturday and recs for oral care and ice chips to help preserve swallowing. ST offered to provide ice chips to Pt, but Nsg reported plans to enter room and provide ice chips. Will follow up to determine needs and how we can best assist Pt.

## 2021-05-31 NOTE — Progress Notes (Signed)
Physical Therapy Treatment Patient Details Name: Terrence Johnson MRN: [id number] DOB: 31-Dec-1940 Today's Date: 05/31/2021   History of Present Illness 80 year old male presenting to [facility] ED on 05/23/2021 via EMS from a group home with  altered mental status.  Apparently he had a fall on Friday at his facility and they noticed some slurred speech as well as difficulty ambulating.    PT Comments    Patient agreeable to PT and no agitation was noted during session. Patient needs maximal assistance for bed mobility. +2 person assistance for sit to stand transfers performed x 2 bouts. Standing activity tolerance limited due to LE weakness and patient unable to progress to walking this session. Heart rate briefly up to 155bpm while standing but decreases with rest. Recommend to continue PT to maximize independence and facilitate return to prior level of function.    Recommendations for follow up therapy are one component of a multi-disciplinary discharge planning process, led by the attending physician.  Recommendations may be updated based on patient status, additional functional criteria and insurance authorization.  Follow Up Recommendations  SNF;Supervision/Assistance - 24 hour     Equipment Recommendations   (to be determined at next level of care)    Recommendations for Other Services       Precautions / Restrictions Precautions Precautions: Fall Restrictions Weight Bearing Restrictions: No     Mobility  Bed Mobility Overal bed mobility: Needs Assistance Bed Mobility: Supine to Sit;Sit to Supine     Supine to sit: Max assist Sit to supine: Max assist   General bed mobility comments: assistance for LE and trunk support. verbal cues for sequencing and technique. increased time and effort required    Transfers Overall transfer level: Needs assistance Equipment used: Rolling walker (2 wheeled) (with and without rolling walker) Transfers: Sit to/from Stand Sit to Stand: Max  assist;+2 physical assistance         General transfer comment: 2 bouts of standing performed with and without rolling walker. patient needs lifting and lowering assistance  Ambulation/Gait             General Gait Details: unable to stand Cura enough to progress ambulation today.   Stairs             Wheelchair Mobility    Modified Rankin (Stroke Patients Only)       Balance Overall balance assessment: Needs assistance Sitting-balance support: Bilateral upper extremity supported Sitting balance-Leahy Scale: Fair     Standing balance support: Bilateral upper extremity supported Standing balance-Leahy Scale: Poor Standing balance comment: Max A reuqired to maintain standing balance. posterior lean. standing tolerance of ~ 15-20 seconds                            Cognition Arousal/Alertness: Awake/alert Behavior During Therapy: WFL for tasks assessed/performed Overall Cognitive Status: No family/caregiver present to determine baseline cognitive functioning                                 General Comments: patient with no agitation during session. patient needs increased time to follow commands. oriented to person only      Exercises      General Comments        Pertinent Vitals/Pain Pain Assessment: No/denies pain    Home Living  Prior Function            PT Goals (current goals can now be found in the care plan section) Acute Rehab PT Goals Patient Stated Goal: none stated PT Goal Formulation: Patient unable to participate in goal setting Time For Goal Achievement: 06/13/21 Potential to Achieve Goals: Fair Progress towards PT goals: Progressing toward goals    Frequency    Min 2X/week      PT Plan Current plan remains appropriate    Co-evaluation PT/OT/SLP Co-Evaluation/Treatment: Yes Reason for Co-Treatment: To address functional/ADL transfers PT goals addressed during  session: Mobility/safety with mobility OT goals addressed during session: ADL's and self-care      AM-PAC PT "6 Clicks" Mobility   Outcome Measure  Help needed turning from your back to your side while in a flat bed without using bedrails?: A Lot Help needed moving from lying on your back to sitting on the side of a flat bed without using bedrails?: A Lot Help needed moving to and from a bed to a chair (including a wheelchair)?: A Lot Help needed standing up from a chair using your arms (e.g., wheelchair or bedside chair)?: A Lot Help needed to walk in hospital room?: A Lot Help needed climbing 3-5 steps with a railing? : Total 6 Click Score: 11    End of Session   Activity Tolerance: Patient limited by fatigue Patient left: in bed;with call bell/phone within reach;with nursing/sitter in room   PT Visit Diagnosis: Muscle weakness (generalized) (M62.81);Unsteadiness on feet (R26.81);Difficulty in walking, not elsewhere classified (R26.2);History of falling (Z91.81)     Time: 4196-2229 PT Time Calculation (min) (ACUTE ONLY): 24 min  Charges:  $Therapeutic Activity: 8-22 mins                    Donna Bernard, PT, MPT    [name] 05/31/2021, 2:57 PM

## 2021-05-31 NOTE — Progress Notes (Signed)
Terrence Johnson  ZOX:[id number] DOB: 27-Mar-1941 DOA: 05/23/2021 PCP: Patient, No Pcp Per (Inactive)    Brief Narrative:  80yo SNF resident with a history of COPD chronic cognitive impairment, DM2, HLD, psychotic disorder NOS, and HTN who presented to [facility] ED 05/23/2021 via EMS from the facility in which he lives with reports of AMS.  Per ED documentation the patient suffered a facility and at some point later after his fall exhibiting slurred speech as well as difficulty ambulating.  Nonverbal therefore EMS was summoned EMS discovered a heart rate in the 30s with blood pressure of 70/50.  He was dosed with atropine and IV fluid and transported to the ED.  Upon arrival in the ED the patient remains significantly altered.  He was noted to have ecchymosis over his right as well as bilateral knees in the ER he was found to be markedly hypothermic with a rectal temperature of 85.  He was also noted to be COVID-positive.  CT head and cervical spine at time of presentation was without acute findings.  Significant Events:  10/2 admit via [facility] ED hypothermic, hypotensive, bradycardic - Bair hugger + IVF  10/3 recurrent symptomatic bradycardia 10/4 remains altered  10/7 beginning to improve mental status  10/8 MRI brain unremarkable   Date of Positive COVID Test:  05/23/21 (last day of isolation 06/02/21)   COVID-19 specific Treatment: Remdesivir 10/2 > 10/6 Hydrocortisone 10/2 > 10/6  Consultants:  [facility] Cardiology Psychiatry Neurology Gastroenterology  Code Status: NO CODE BLUE  Antimicrobials:  Zosyn 10/2 > 10/8 Vancomycin 10/2 Fosfomycin 10/9  DVT prophylaxis:  SCDs  Disposition plan: Patient requires SNF level care  Subjective: TRH assumed care of this patient from the PCCM team this morning.  Temperature remains below normal.  Blood pressure stable/trending up.  Heart rate consistently in the 70-80 range.  Saturation 98% room air.  Patient is alert and able to answer some very simple  questions.  Denies uncontrolled pain.  Appears to be resting comfortably.  Assessment & Plan:  Symptomatic bradycardia -first-degree heart block with QT prolongation Query etiology of syncopal episodes leading to multiple recent falls -was on beta-blocker at time of admission which has been discontinued -episodic bradycardia persist but no hypotension associated with this  Mildly elevated troponin -demand ischemia related to bradycardia Evaluated by cardiology with no indication for further work-up  Circulatory shock Resolved  Multifactorial acute encephalopathy - baseline dementia  Hypothermia, bradycardia hypotension, infection all contributing -appears to have returned to his probable baseline  Severe oral phase dysphagia likely multi-factorial -being followed by SLP -presently ice chips and sips of water via straw are recommended to attempt to preserve swallow musculature -[place]ca-term prognosis for "safe" resumption of p.o. intake felt to be quite poor  COVID infection Treated with Remdesivir and steroid -no evidence of persisting/active infection  Thrombocytopenia Likely related to COVID -recheck in a.m.  DM2 CBG well controlled at present  Enterococcus and Streptococcus cystitis Has completed an antibiotic course  Diverticulitis of cecum Has completed an antibiotic course  Schizophrenia Psychiatry following and dosing medications   Family Communication: Call placed to brother's number but did not get an answer Status is: Inpatient  Remains inpatient appropriate because:Inpatient level of care appropriate due to severity of illness  Dispo: The patient is from: SNF              Anticipated d/c is to: SNF              Patient currently is not medically  stable to d/c.   Difficult to place patient No   Objective: Blood pressure (!) 151/73, pulse 82, temperature (!) 96.1 F (35.6 C), resp. rate 14, height 5\' 7"  (1.702 m), weight 69.3 kg, SpO2 99 %.  Intake/Output  Summary (Last 24 hours) at 05/31/2021 1017 Last data filed at 05/31/2021 0900 Gross per 24 hour  Intake 240 ml  Output 1205 ml  Net -965 ml   Filed Weights   05/23/21 0747 05/23/21 1750  Weight: 68 kg 69.3 kg    Examination: General: No acute respiratory distress Lungs: Clear to auscultation bilaterally with exception to mild bibasilar crackles Cardiovascular: Regular rate and rhythm without murmur gallop or rub normal S1 and S2 Abdomen: Nontender, nondistended, soft, bowel sounds positive, no rebound, no ascites, no appreciable mass Extremities: No significant cyanosis, clubbing, or edema bilateral lower extremities  CBC: Recent Labs  Lab 05/27/21 0620 05/28/21 0521 05/29/21 0750 05/30/21 0616  WBC 4.6 6.2 6.1 8.5  NEUTROABS 3.1  --   --   --   HGB 10.1* 9.8* 9.4* 10.8*  HCT 30.5* 29.2* 28.2* 32.4*  MCV 96.2 96.1 94.0 92.8  PLT 91* 107* 104* 110*   Basic Metabolic Panel: Recent Labs  Lab 05/28/21 0521 05/29/21 0750 05/30/21 0616  NA 142 140 143  K 3.5 2.9* 3.5  CL 109 103 106  CO2 24 28 29   GLUCOSE 102* 104* 95  BUN 30* 25* 21  CREATININE 1.36* 1.26* 1.12  CALCIUM 8.9 8.7* 8.9  MG 1.9 1.8 1.8  PHOS 3.5 3.0 1.8*   GFR: Estimated Creatinine Clearance: 50 mL/min (by C-G formula based on SCr of 1.12 mg/dL).   Recent Labs  Lab 05/26/21 1305  AMMONIA <10     HbA1C: Hgb A1c MFr Bld  Date/Time Value Ref Range Status  05/23/2021 06:15 PM 7.9 (H) 4.8 - 5.6 % Final    Comment:    (NOTE)         Prediabetes: 5.7 - 6.4         Diabetes: >6.4         Glycemic control for adults with diabetes: <7.0   12/16/2020 06:22 AM 6.2 (H) 4.8 - 5.6 % Final    Comment:    (NOTE) Pre diabetes:          5.7%-6.4%  Diabetes:              >6.4%  Glycemic control for   <7.0% adults with diabetes     CBG: Recent Labs  Lab 05/30/21 1625 05/30/21 1924 05/30/21 2354 05/31/21 0443 05/31/21 0735  GLUCAP 86 102* 103* 84 78    Recent Results (from the past 240  hour(s))  Resp Panel by RT-PCR (Flu A&B, Covid) Nasopharyngeal Swab     Status: Abnormal   Collection Time: 05/23/21  7:50 AM   Specimen: Nasopharyngeal Swab; Nasopharyngeal(NP) swabs in vial transport medium  Result Value Ref Range Status   SARS Coronavirus 2 by RT PCR POSITIVE (A) NEGATIVE Final    Comment: RESULT CALLED TO, READ BACK BY AND VERIFIED WITH: JENNA BELCHER AT 1008 ON 05/23/2021 [facility]. (NOTE) SARS-CoV-2 target nucleic acids are DETECTED.  The SARS-CoV-2 RNA is generally detectable in upper respiratory specimens during the acute phase of infection. Positive results are indicative of the presence of the identified virus, but do not rule out bacterial infection or co-infection with other pathogens not detected by the test. Clinical correlation with patient history and other diagnostic information is necessary to determine patient infection  status. The expected result is Negative.  Fact Sheet for Patients: BloggerCourse.com  Fact Sheet for Healthcare Providers: SeriousBroker.it  This test is not yet approved or cleared by the [place] FDA and  has been authorized for detection and/or diagnosis of SARS-CoV-2 by FDA under an Emergency Use Authorization (EUA).  This EUA will remain in effect (meaning this test  can be used) for the duration of  the COVID-19 declaration under Section 564(b)(1) of the Act, 21 U.S.C. section 360bbb-3(b)(1), unless the authorization is terminated or revoked sooner.     Influenza A by PCR NEGATIVE NEGATIVE Final   Influenza B by PCR NEGATIVE NEGATIVE Final    Comment: (NOTE) The Xpert Xpress SARS-CoV-2/FLU/RSV plus assay is intended as an aid in the diagnosis of influenza from Nasopharyngeal swab specimens and should not be used as a sole basis for treatment. Nasal washings and aspirates are unacceptable for Xpert Xpress SARS-CoV-2/FLU/RSV testing.  Fact Sheet for  Patients: BloggerCourse.com  Fact Sheet for Healthcare Providers: SeriousBroker.it  This test is not yet approved or cleared by the [place] FDA and has been authorized for detection and/or diagnosis of SARS-CoV-2 by FDA under an Emergency Use Authorization (EUA). This EUA will remain in effect (meaning this test can be used) for the duration of the COVID-19 declaration under Section 564(b)(1) of the Act, 21 U.S.C. section 360bbb-3(b)(1), unless the authorization is terminated or revoked.  Performed at [facility], [address], [place], [place] [address]   Urine Culture     Status: Abnormal (Preliminary result)   Collection Time: 05/23/21  7:50 AM   Specimen: Urine, Catheterized  Result Value Ref Range Status   Specimen Description   Final    URINE, CATHETERIZED Performed at [facility], [address], [place], [place] [address]    Special Requests   Final    NONE Performed at [facility], [address] [place], [place] [address]    Culture (A)  Final    >=100,000 COLONIES/mL STREPTOCOCCUS MITIS/ORALIS >=100,000 COLONIES/mL ENTEROCOCCUS FAECALIS Sent to [facility] for further susceptibility testing. Performed at [facility], [address], [place], [place] [address]    Report Status PENDING  Incomplete   Organism ID, Bacteria STREPTOCOCCUS MITIS/ORALIS (A)  Final      Susceptibility   Streptococcus mitis/oralis - MIC*    TETRACYCLINE 0.5 SENSITIVE Sensitive     VANCOMYCIN 0.25 SENSITIVE Sensitive     CLINDAMYCIN <=0.25 SENSITIVE Sensitive     * >=100,000 COLONIES/mL STREPTOCOCCUS MITIS/ORALIS  Blood culture (routine x 2)     Status: None   Collection Time: 05/23/21  9:24 AM   Specimen: BLOOD  Result Value Ref Range Status   Specimen Description BLOOD LEFT ANTECUBITAL  Final   Special Requests   Final    BOTTLES DRAWN AEROBIC AND ANAEROBIC Blood Culture results may not be  optimal due to an inadequate volume of blood received in culture bottles   Culture   Final    NO GROWTH 5 DAYS Performed at [facility], [address], [place], [place] [address]    Report Status 05/28/2021 FINAL  Final  Blood culture (routine x 2)     Status: None   Collection Time: 05/23/21  9:24 AM   Specimen: BLOOD LEFT HAND  Result Value Ref Range Status   Specimen Description BLOOD LEFT HAND  Final   Special Requests   Final    BOTTLES DRAWN AEROBIC AND ANAEROBIC Blood Culture adequate volume  Culture   Final    NO GROWTH 5 DAYS Performed at [facility], [address] [place] [place], [place] [address]    Report Status 05/28/2021 FINAL  Final  Susceptibility, Aer + Anaerob     Status: Abnormal   Collection Time: 05/26/21  7:50 AM  Result Value Ref Range Status   Suscept, Aer + Anaerob Preliminary report (A)  Final    Comment: (NOTE) Performed At: [facility] [address] [place], [place] [id number] Jolene Schimke MD VK:[id number]    Source of Sample ENTEROCOCCUS FAECALIS/ URINE  Final    Comment: Performed at [facility], [address], [place], [place] [address]  Susceptibility Result     Status: Abnormal   Collection Time: 05/26/21  7:50 AM  Result Value Ref Range Status   Suscept Result 1 Enterococcus faecalis (A)  Final    Comment: (NOTE) Identification performed by account, not confirmed by this laboratory. Performed At: [facility] [address] [place], [place] [id number] Jolene Schimke MD OL:[id number]      Scheduled Meds:  amLODipine  5 mg Per Tube Daily   aspirin  81 mg Per Tube Daily   atorvastatin  40 mg Per Tube QHS   benztropine  1 mg Per Tube BID   chlorhexidine  15 mL Mouth Rinse BID   Chlorhexidine Gluconate Cloth  6 each Topical Daily   clopidogrel  75 mg Per Tube Daily   hydrALAZINE  10 mg Per Tube Q6H   Ipratropium-Albuterol  1 puff Inhalation BID   LORazepam  1 mg Intravenous Once   mouth  rinse  15 mL Mouth Rinse q12n4p   nicotine  21 mg Transdermal Daily   pantoprazole (PROTONIX) IV  40 mg Intravenous Q24H   pantoprazole sodium  40 mg Per Tube Q0600   Continuous Infusions:  sodium chloride Stopped (05/24/21 0753)   dextrose 5% lactated ringers Stopped (05/29/21 1645)   magnesium sulfate bolus IVPB     piperacillin-tazobactam Stopped (05/29/21 1606)     LOS: 8 days   Lonia Blood, MD [facility] Office  [phone] Pager - Text Page per Loretha Stapler  If 7PM-7AM, please contact night-coverage per Amion 05/31/2021, 10:17 AM

## 2021-05-31 NOTE — Progress Notes (Signed)
Initial Nutrition Assessment  DOCUMENTATION CODES:   Not applicable  INTERVENTION:   If tube feeds initiated, recommend:  Osmolite 1.2@70ml /hr- Initiate at 68m/hr and increase by 114mhr q 6 hours until goal rate is reached.   Free water flushes 10083m4 hours   Regimen provides 2016kcal/day, 93g/day protein and 1978m28my free water   NUTRITION DIAGNOSIS:   Inadequate oral intake related to acute illness as evidenced by NPO status.  GOAL:   Patient will meet greater than or equal to 90% of their needs  MONITOR:   Labs, Weight trends, Diet advancement, TF tolerance, Skin, I & O's  REASON FOR ASSESSMENT:   Malnutrition Screening Tool    ASSESSMENT:   80 y51. male with medical history significant of schizophrenia, diabetes, cognitive impairment, dementia, COPD, etoh abuse, CKD III, HLD and HTN who presents with altered mental status and was found to have COVID 19, first-degree heart block and thrombocytopenia.  Met with pt in room today. Pt unable to provide nutrition related history but is able to say hello and report that he feels ok. Pt has been NPO since 10/3 and is now without adequate nutrition of > 7 days. Palliative care is following. Pt's family initially refusing NGT and feeds but pt now s/p NGT placement 10/8 for medication administration. Spoke with MD, plan is to speak with family regarding GOC and to initiate tube feeds if they wish to continue with full scope. Pt is at high refeed risk. Per chart, pt is down 12lbs(7%) over the past 6 months; this is not significant. Pt has not been weighed since admit; will request daily weights.    Medications reviewed and include: plavix, nicotine, protonix  Labs reviewed: K 3.5 wnl, P 1.8(L), Mg 1.8 wnl Hgb 10.8(L), Hct 32.4(L)  NUTRITION - FOCUSED PHYSICAL EXAM:  Flowsheet Row Most Recent Value  Orbital Region No depletion  Upper Arm Region No depletion  Thoracic and Lumbar Region No depletion  Buccal Region No  depletion  Temple Region No depletion  Clavicle Bone Region No depletion  Clavicle and Acromion Bone Region No depletion  Scapular Bone Region No depletion  Dorsal Hand Mild depletion  Patellar Region Severe depletion  Anterior Thigh Region Severe depletion  Posterior Calf Region Severe depletion  Edema (RD Assessment) Mild  Hair Reviewed  Eyes Reviewed  Mouth Reviewed  Skin Reviewed  Nails Reviewed   Diet Order:   Diet Order             Diet NPO time specified  Diet effective now                  EDUCATION NEEDS:   No education needs have been identified at this time  Skin:  Skin Assessment: Reviewed RN Assessment (ecchymosis)  Last BM:  10/10- type 6  Height:   Ht Readings from Last 1 Encounters:  05/23/21 5' 7"  (1.702 m)    Weight:   Wt Readings from Last 1 Encounters:  05/23/21 69.3 kg    Ideal Body Weight:  67.2 kg  BMI:  Body mass index is 23.93 kg/m.  Estimated Nutritional Needs:   Kcal:  1700-2000kcal/day  Protein:  85-100g/day  Fluid:  1.7-2.0L/day  CaseKoleen Distance RD, LDN Please refer to AMIOGi Or Norman RD and/or RD on-call/weekend/after hours pager

## 2021-05-31 NOTE — Evaluation (Signed)
Occupational Therapy Evaluation Patient Details Name: Terrence Johnson MRN: [id number] DOB: 12/19/1940 Today's Date: 05/31/2021   History of Present Illness 80 year-old male presenting to [facility] ED on 05/23/2021 via EMS from a facility with complaints of altered mental status.  Per ED documentation patient had a fall on 9/30 at his facility and they noticed some slurred speech as well as difficulty ambulating.  Per EMS report he was bradycardic in the 30s and hypotensive with a BP 70s over 50s. MRI brain negative for acute process. PMH: schizophrenia, diabetes, cognitive impairment, dementia, COPD   Clinical Impression   Pt seen for OT evaluation this date. Session coordinated with PT to address cognition/behavior during functional activity and to address functional/ADL transfers. Upon arrival to room, pt awake in bed, with mittens donned, and 1:1 sitter at bedside. Pt A&Ox1 and agreeable to session. Pt pleasant, agreeable throughout, and appropriately answering questions with 1-2 words. No agitation noted this date. PLOF/home set-up obtained via pt report and via previous encounter (June 2022); plan to confirm with caregiver when able. Prior to admission, pt was living in a group home, performing ADLs and functional mobility with RW. Pt denies any recent falls.   Pt currently presents with decreased awareness of deficits, decreased strength, and decreased activity tolerance. Due to these functional impairments, pt requires SUPERVISION/SET-UP for bed-level grooming tasks, MAX A for bed mobility, and MAX A+2 for sit<>stand transfers with RW. Pt unable to engage UE in seated ADLs this date d/t requiring b/l UE support while sitting EOB. Pt would benefit from additional skilled OT services to maximize return to PLOF and minimize risk of future falls, injury, caregiver burden, and readmission. Upon discharge, recommend SNF.         Recommendations for follow up therapy are one component of a multi-disciplinary  discharge planning process, led by the attending physician.  Recommendations may be updated based on patient status, additional functional criteria and insurance authorization.   Follow Up Recommendations  SNF    Equipment Recommendations  Other (comment) (defer to next venue of care)       Precautions / Restrictions Precautions Precautions: Fall Restrictions Weight Bearing Restrictions: No      Mobility Bed Mobility Overal bed mobility: Needs Assistance Bed Mobility: Supine to Sit;Sit to Supine     Supine to sit: Max assist Sit to supine: Max assist   General bed mobility comments: assistance for LE and trunk support. verbal cues for sequencing and technique. increased time and effort required    Transfers Overall transfer level: Needs assistance Equipment used: Rolling walker (2 wheeled) Transfers: Sit to/from Stand Sit to Stand: Max assist;+2 physical assistance         General transfer comment: 2 bouts of standing performed with and without rolling walker. patient needs lifting and lowering assistance    Balance Overall balance assessment: Needs assistance Sitting-balance support: Bilateral upper extremity supported;Feet supported Sitting balance-Leahy Scale: Fair Sitting balance - Comments: Able to maintain static sitting balance with b/l UE supported.   Standing balance support: Bilateral upper extremity supported;During functional activity Standing balance-Leahy Scale: Poor Standing balance comment: Max A required to maintain standing balance for ~15-20 seconds                           ADL either performed or assessed with clinical judgement   ADL Overall ADL's : Needs assistance/impaired  General ADL Comments: SUPERVISION/SET-UP for bed-level grooming tasks. Pt unable to engage UE in seated ADLs this date d/t requiring b/l UE support while sitting EOB. MOD/MAX A for LB ADLs     Vision    Additional Comments: Unable to perform formal vision assessment this date d/t pt with difficulty following instructions for vision assessment, however appears [facility]            Pertinent Vitals/Pain Pain Assessment: No/denies pain        Extremity/Trunk Assessment Upper Extremity Assessment Upper Extremity Assessment: Generalized weakness;RUE deficits/detail;LUE deficits/detail RUE Deficits / Details: Shoulder flexion and elbow flex/ext 3+/5. Grip strength 4/5. Pt endorses decreased sensation compared to L-side RUE Sensation: decreased light touch LUE Deficits / Details: shoulder flexion and elbow flex/ext 3+/5. Grip strength 4/5. Increased difficulty noted performing digit opposition LUE Coordination: decreased fine motor   Lower Extremity Assessment Lower Extremity Assessment: Generalized weakness       Communication Communication Communication: Other (comment) (Limited verbal communication with mostly short answers to questions only)   Cognition Arousal/Alertness: Awake/alert Behavior During Therapy: WFL for tasks assessed/performed Overall Cognitive Status: No family/caregiver present to determine baseline cognitive functioning                                 General Comments: Pt A&Ox1. Pleasant and agreeable throughout and no agitation noted during session. Answers questions with 1-2 words. Requires multi-modal cues for following instructions.              Home Living Family/patient expects to be discharged to:: Group home                                        Prior Functioning/Environment Level of Independence: Needs assistance  Gait / Transfers Assistance Needed: Per previous encounter (June 2022), pt is mod-independent with functional mobility with RW at baseline. Pt denies any recent falls ADL's / Homemaking Assistance Needed: Per previous encounter (June 2022), staff at group home assist with meals, meds, and ADL set up             OT Problem List: Decreased strength;Decreased activity tolerance;Impaired balance (sitting and/or standing);Decreased cognition      OT Treatment/Interventions: Self-care/ADL training;Therapeutic exercise;Energy conservation;DME and/or AE instruction;Therapeutic activities;Balance training;Patient/family education    OT Goals(Current goals can be found in the care plan section) Acute Rehab OT Goals Patient Stated Goal: none stated OT Goal Formulation: Patient unable to participate in goal setting Time For Goal Achievement: 06/14/21 ADL Goals Pt Will Perform Grooming: with supervision;with set-up;sitting Pt Will Perform Lower Body Dressing: with min assist;with adaptive equipment;sitting/lateral leans Pt Will Transfer to Toilet: with mod assist;stand pivot transfer;bedside commode  OT Frequency: Min 2X/week       Co-evaluation PT/OT/SLP Co-Evaluation/Treatment: Yes Reason for Co-Treatment: Necessary to address cognition/behavior during functional activity;For patient/therapist safety;To address functional/ADL transfers PT goals addressed during session: Mobility/safety with mobility OT goals addressed during session: ADL's and self-care      AM-PAC OT "6 Clicks" Daily Activity     Outcome Measure Help from another person eating meals?: A Little Help from another person taking care of personal grooming?: A Little Help from another person toileting, which includes using toliet, bedpan, or urinal?: A Lot Help from another person bathing (including washing, rinsing, drying)?: A Lot Help from another person to put on and  taking off regular upper body clothing?: A Little Help from another person to put on and taking off regular lower body clothing?: A Lot 6 Click Score: 15   End of Session Equipment Utilized During Treatment: Rolling walker Nurse Communication: Mobility status  Activity Tolerance: Patient tolerated treatment well Patient left: in bed;with call bell/phone within  reach;with bed alarm set;with nursing/sitter in room  OT Visit Diagnosis: Unsteadiness on feet (R26.81);Muscle weakness (generalized) (M62.81);Other symptoms and signs involving cognitive function                Time: 6759-1638 OT Time Calculation (min): 26 min Charges:  OT General Charges $OT Visit: 1 Visit OT Evaluation $OT Eval Moderate Complexity: 1 Mod  Matthew Folks, OTR/L [facility] [phone]

## 2021-06-01 DIAGNOSIS — N3000 Acute cystitis without hematuria: Secondary | ICD-10-CM | POA: Diagnosis not present

## 2021-06-01 LAB — URINE CULTURE: Culture: >=100000 — AB

## 2021-06-01 LAB — GLUCOSE, CAPILLARY: Glucose-Capillary: 93 mg/dL (ref 70–99)

## 2021-06-01 LAB — COMPREHENSIVE METABOLIC PANEL
ALT: 25 U/L (ref 0–44)
AST: 17 U/L (ref 15–41)
Albumin: 2.4 g/dL — ABNORMAL LOW (ref 3.5–5.0)
Alkaline Phosphatase: 65 U/L (ref 38–126)
Anion gap: 10 (ref 5–15)
BUN: 13 mg/dL (ref 8–23)
CO2: 27 mmol/L (ref 22–32)
Calcium: 8.5 mg/dL — ABNORMAL LOW (ref 8.9–10.3)
Chloride: 102 mmol/L (ref 98–111)
Creatinine, Ser: 1.14 mg/dL (ref 0.61–1.24)
GFR, Estimated: >60 mL/min (ref >60)
Glucose, Bld: 96 mg/dL (ref 70–99)
Potassium: 2.9 mmol/L — ABNORMAL LOW (ref 3.5–5.1)
Sodium: 139 mmol/L (ref 135–145)
Total Bilirubin: 1 mg/dL (ref 0.3–1.2)
Total Protein: 5.4 g/dL — ABNORMAL LOW (ref 6.5–8.1)

## 2021-06-01 LAB — HEPATITIS PANEL, ACUTE
Hep A IgM: NONREACTIVE
Hep B C IgM: NONREACTIVE
Hepatitis B Surface Ag: NONREACTIVE

## 2021-06-01 LAB — CBC
HCT: 27.4 % — ABNORMAL LOW (ref 39.0–52.0)
Hemoglobin: 9.5 g/dL — ABNORMAL LOW (ref 13.0–17.0)
MCH: 31.5 pg (ref 26.0–34.0)
MCHC: 34.7 g/dL (ref 30.0–36.0)
MCV: 90.7 fL (ref 80.0–100.0)
Platelets: 117 10*3/uL — ABNORMAL LOW (ref 150–400)
RBC: 3.02 MIL/uL — ABNORMAL LOW (ref 4.22–5.81)
RDW: 16.1 % — ABNORMAL HIGH (ref 11.5–15.5)
WBC: 7.6 10*3/uL (ref 4.0–10.5)
nRBC: 0 % (ref 0.0–0.2)

## 2021-06-01 MED ORDER — MORPHINE SULFATE (PF) 2 MG/ML IV SOLN: 1.0000 mg | INTRAVENOUS | Status: DC | PRN

## 2021-06-01 MED ORDER — ONDANSETRON 4 MG PO TBDP
4.0000 mg | ORAL_TABLET | Freq: Four times a day (QID) | ORAL | Status: DC | PRN
  Filled 2021-06-01: qty 1

## 2021-06-01 MED ORDER — ACETAMINOPHEN 325 MG PO TABS: 650.0000 mg | ORAL_TABLET | Freq: Four times a day (QID) | ORAL | Status: DC | PRN

## 2021-06-01 MED ORDER — ACETAMINOPHEN 650 MG RE SUPP: 650.0000 mg | Freq: Four times a day (QID) | RECTAL | Status: DC | PRN

## 2021-06-01 MED ORDER — DIPHENHYDRAMINE HCL 50 MG/ML IJ SOLN: 12.5000 mg | INTRAMUSCULAR | Status: DC | PRN

## 2021-06-01 MED ORDER — POTASSIUM CHLORIDE 20 MEQ PO PACK
40.0000 meq | PACK | Freq: Three times a day (TID) | ORAL | Status: DC
  Administered 2021-06-01: 40 meq
  Filled 2021-06-01: qty 2

## 2021-06-01 MED ORDER — HALOPERIDOL LACTATE 5 MG/ML IJ SOLN: 0.5000 mg | INTRAMUSCULAR | Status: DC | PRN

## 2021-06-01 MED ORDER — HALOPERIDOL 0.5 MG PO TABS
0.5000 mg | ORAL_TABLET | ORAL | Status: DC | PRN
  Filled 2021-06-01: qty 1

## 2021-06-01 MED ORDER — HALOPERIDOL LACTATE 2 MG/ML PO CONC
0.5000 mg | ORAL | Status: DC | PRN
  Filled 2021-06-01: qty 0.3

## 2021-06-01 MED ORDER — BIOTENE DRY MOUTH MT LIQD: 15.0000 mL | OROMUCOSAL | Status: DC | PRN

## 2021-06-01 MED ORDER — MORPHINE SULFATE (CONCENTRATE) 10 MG/0.5ML PO SOLN
5.0000 mg | ORAL | Status: DC | PRN
  Administered 2021-06-04: 09:00:00 5 mg via ORAL
  Filled 2021-06-01: qty 0.5

## 2021-06-01 MED ORDER — SENNA 8.6 MG PO TABS: 1.0000 | ORAL_TABLET | Freq: Every evening | ORAL | Status: DC | PRN

## 2021-06-01 MED ORDER — LORAZEPAM 2 MG/ML IJ SOLN: 1.0000 mg | INTRAMUSCULAR | Status: DC | PRN

## 2021-06-01 MED ORDER — LORAZEPAM 1 MG PO TABS: 1.0000 mg | ORAL_TABLET | ORAL | Status: DC | PRN

## 2021-06-01 MED ORDER — MORPHINE SULFATE (CONCENTRATE) 10 MG/0.5ML PO SOLN
5.0000 mg | ORAL | Status: DC | PRN
  Administered 2021-06-06: 5 mg via SUBLINGUAL
  Filled 2021-06-01: qty 0.5

## 2021-06-01 MED ORDER — LORAZEPAM 2 MG/ML PO CONC
1.0000 mg | ORAL | Status: DC | PRN
  Filled 2021-06-01: qty 0.5

## 2021-06-01 MED ORDER — ONDANSETRON HCL 4 MG/2ML IJ SOLN: 4.0000 mg | Freq: Four times a day (QID) | INTRAMUSCULAR | Status: DC | PRN

## 2021-06-01 NOTE — Progress Notes (Signed)
Terrence Johnson  XBD:[id number] DOB: 1941-07-28 DOA: 05/23/2021 PCP: Patient, No Pcp Per (Inactive)    Brief Narrative:  80yo group home resident with a history of COPD, chronic cognitive impairment, DM2, HLD, psychotic disorder NOS, and HTN who presented to [facility] ED 05/23/2021 via EMS from the facility in which he lives with reports of AMS.  Per ED documentation the patient suffered a fall at his facility and at some point after his fall was noted to be exhibiting slurred speech as well as difficulty ambulating. EMS was summoned and discovered a heart rate in the 30s with blood pressure of 70/50.  He was dosed with atropine and IV fluid and transported to the ED.  Upon arrival in the ED the patient remained significantly altered.  He was noted to have ecchymosis over his right side as well as bilateral knees. In the ER he was found to be markedly hypothermic with a rectal temperature of 85.  He was also noted to be COVID-positive.  CT head and cervical spine at time of presentation was without acute findings.  Significant Events:  10/2 admit via [facility] ED hypothermic, hypotensive, bradycardic - Bair hugger + IVF  10/3 recurrent symptomatic bradycardia 10/4 remains altered  10/7 beginning to improve mental status  10/8 MRI brain unremarkable   Date of Positive COVID Test:  05/23/21 (last day of isolation 06/02/21)   COVID-19 specific Treatment: Remdesivir 10/2 > 10/6 Hydrocortisone 10/2 > 10/6  Consultants:  [facility] Cardiology Psychiatry Neurology Gastroenterology  Code Status: NO CODE BLUE  Antimicrobials:  Zosyn 10/2 > 10/8 Vancomycin 10/2 Fosfomycin 10/9  DVT prophylaxis:  SCDs  Disposition plan: Patient requires SNF level care  Subjective: No acute events reported overnight.  NG remains in place.  An IV has not been able to be acquired.  Temperature is stable.  Vital signs are stable.  At the time of my visit the patient is alert and pleasant.  He tells me he is hungry.  He tells  me he would like to have the tube removed from his nose.  He denies chest pain or shortness of breath.  I was able to reach the patient's emergency contact, his brother.  We had a Kita discussion about the patient's current status and his goals of care.  We were both in full agreement that ongoing aggressive care and artificial means of nutrition were not appropriate for this patient, and the brother agreed it is not what the patient himself would want.  We mutually agreed to transition to a comfort focused care, allowing the patient to eat with the understanding that he would likely aspirate and that this could potentially lead to respiratory issues and even death.  The patient's brother understands that his dysphagia and cognitive deficit have progressed to the point that he is not able to care for himself appropriately and not able to adequately nourish himself and that this indicates that he is in the early stages of his dying process.  His brother is interested and having him placed within a residential hospice facility, or a SNF with hospice care depending upon what is available/what the patient qualifies for.  Assessment & Plan:  Symptomatic bradycardia -first-degree heart block with QT prolongation Likely the etiology of syncopal episodes leading to multiple recent falls -was on beta-blocker at time of admission which has been discontinued -episodic bradycardia persist but no hypotension associated with this and overall heart rate has normalized  Mildly elevated troponin -demand ischemia related to bradycardia Evaluated by  cardiology with no indication for further work-up  Circulatory shock Resolved - likely due to a combination of acute infection as well as beta-blocker overdose  Multifactorial acute encephalopathy - baseline dementia  Hypothermia, bradycardia hypotension, infection all contributing -appears to have returned to his probable baseline -family reports gradual and steady  cognitive decline in the past few months  Severe oral phase dysphagia likely multi-factorial -has been evaluated by by SLP - ong-term prognosis for "safe" resumption of p.o. intake felt to be quite poor and therefore short-term use of NG feedings not appropriate - not an appropriate candidate for Fort-term tube feedings, and family does not feel this would be right for him even if he was  Hypokalemia Due to no intake   COVID infection Treated with Remdesivir and steroid -no evidence of persisting/active infection  Thrombocytopenia Likely related to COVID - platelet count improving  DM2 CBG well controlled   Enterococcus and Streptococcus cystitis Has completed an antibiotic course  Diverticulitis of cecum Has completed an antibiotic course  Schizophrenia Psychiatry has evaluated  Disposition As noted above we are transitioning to full comfort care only with the ultimate goal of residential hospice or SNF with hospice placement   Family Communication: Spoke with brother at length on the telephone Status is: Inpatient  Remains inpatient appropriate because:Inpatient level of care appropriate due to severity of illness  Dispo: The patient is from: SNF              Anticipated d/c is to: SNF              Patient currently is not medically stable to d/c.   Difficult to place patient No   Objective: Blood pressure (!) 118/45, pulse 88, temperature 97.8 F (36.6 C), resp. rate 14, height 5\' 7"  (1.702 m), weight 69.3 kg, SpO2 100 %.  Intake/Output Summary (Last 24 hours) at 06/01/2021 1659 Last data filed at 06/01/2021 1000 Gross per 24 hour  Intake --  Output 1250 ml  Net -1250 ml   Filed Weights   05/23/21 0747 05/23/21 1750  Weight: 68 kg 69.3 kg    Examination: General: No acute respiratory distress Lungs: Mild bibasilar crackles Cardiovascular: Regular rate and rhythm  Abdomen: Nontender, nondistended, soft, bowel sounds positive Extremities: No significant  edema bilateral lower extremities  CBC: Recent Labs  Lab 05/27/21 0620 05/28/21 0521 05/29/21 0750 05/30/21 0616 06/01/21 0642  WBC 4.6   < > 6.1 8.5 7.6  NEUTROABS 3.1  --   --   --   --   HGB 10.1*   < > 9.4* 10.8* 9.5*  HCT 30.5*   < > 28.2* 32.4* 27.4*  MCV 96.2   < > 94.0 92.8 90.7  PLT 91*   < > 104* 110* 117*   < > = values in this interval not displayed.   Basic Metabolic Panel: Recent Labs  Lab 05/28/21 0521 05/29/21 0750 05/30/21 0616 06/01/21 0642  NA 142 140 143 139  K 3.5 2.9* 3.5 2.9*  CL 109 103 106 102  CO2 24 28 29 27   GLUCOSE 102* 104* 95 96  BUN 30* 25* 21 13  CREATININE 1.36* 1.26* 1.12 1.14  CALCIUM 8.9 8.7* 8.9 8.5*  MG 1.9 1.8 1.8  --   PHOS 3.5 3.0 1.8*  --    GFR: Estimated Creatinine Clearance: 49.1 mL/min (by C-G formula based on SCr of 1.14 mg/dL).   Recent Labs  Lab 05/26/21 1305  AMMONIA <10  HbA1C: Hgb A1c MFr Bld  Date/Time Value Ref Range Status  05/23/2021 06:15 PM 7.9 (H) 4.8 - 5.6 % Final    Comment:    (NOTE)         Prediabetes: 5.7 - 6.4         Diabetes: >6.4         Glycemic control for adults with diabetes: <7.0   12/16/2020 06:22 AM 6.2 (H) 4.8 - 5.6 % Final    Comment:    (NOTE) Pre diabetes:          5.7%-6.4%  Diabetes:              >6.4%  Glycemic control for   <7.0% adults with diabetes     CBG: Recent Labs  Lab 05/31/21 1152 05/31/21 1547 05/31/21 1930 05/31/21 2208 06/01/21 1053  GLUCAP 117* 140* 126* 141* 93    Recent Results (from the past 240 hour(s))  Resp Panel by RT-PCR (Flu A&B, Covid) Nasopharyngeal Swab     Status: Abnormal   Collection Time: 05/23/21  7:50 AM   Specimen: Nasopharyngeal Swab; Nasopharyngeal(NP) swabs in vial transport medium  Result Value Ref Range Status   SARS Coronavirus 2 by RT PCR POSITIVE (A) NEGATIVE Final    Comment: RESULT CALLED TO, READ BACK BY AND VERIFIED WITH: JENNA BELCHER AT 1008 ON 05/23/2021 [facility]. (NOTE) SARS-CoV-2 target nucleic acids  are DETECTED.  The SARS-CoV-2 RNA is generally detectable in upper respiratory specimens during the acute phase of infection. Positive results are indicative of the presence of the identified virus, but do not rule out bacterial infection or co-infection with other pathogens not detected by the test. Clinical correlation with patient history and other diagnostic information is necessary to determine patient infection status. The expected result is Negative.  Fact Sheet for Patients: BloggerCourse.com  Fact Sheet for Healthcare Providers: SeriousBroker.it  This test is not yet approved or cleared by the [place] FDA and  has been authorized for detection and/or diagnosis of SARS-CoV-2 by FDA under an Emergency Use Authorization (EUA).  This EUA will remain in effect (meaning this test  can be used) for the duration of  the COVID-19 declaration under Section 564(b)(1) of the Act, 21 U.S.C. section 360bbb-3(b)(1), unless the authorization is terminated or revoked sooner.     Influenza A by PCR NEGATIVE NEGATIVE Final   Influenza B by PCR NEGATIVE NEGATIVE Final    Comment: (NOTE) The Xpert Xpress SARS-CoV-2/FLU/RSV plus assay is intended as an aid in the diagnosis of influenza from Nasopharyngeal swab specimens and should not be used as a sole basis for treatment. Nasal washings and aspirates are unacceptable for Xpert Xpress SARS-CoV-2/FLU/RSV testing.  Fact Sheet for Patients: BloggerCourse.com  Fact Sheet for Healthcare Providers: SeriousBroker.it  This test is not yet approved or cleared by the [place] FDA and has been authorized for detection and/or diagnosis of SARS-CoV-2 by FDA under an Emergency Use Authorization (EUA). This EUA will remain in effect (meaning this test can be used) for the duration of the COVID-19 declaration under Section 564(b)(1) of the Act,  21 U.S.C. section 360bbb-3(b)(1), unless the authorization is terminated or revoked.  Performed at [facility], [address], [place], [place] [address]   Urine Culture     Status: Abnormal   Collection Time: 05/23/21  7:50 AM   Specimen: Urine, Catheterized  Result Value Ref Range Status   Specimen Description   Final    URINE, CATHETERIZED Performed at [facility]  [facility], [address], [place], [place] [address]    Special Requests   Final    NONE Performed at [facility], [address] [place], [place] [address]    Culture (A)  Final    >=100,000 COLONIES/mL STREPTOCOCCUS MITIS/ORALIS >=100,000 COLONIES/mL ENTEROCOCCUS FAECALIS SEE SEPARATE REPORT Performed at [facility], [address], [place], [place] [address]    Report Status 06/01/2021 FINAL  Final   Organism ID, Bacteria STREPTOCOCCUS MITIS/ORALIS (A)  Final      Susceptibility   Streptococcus mitis/oralis - MIC*    TETRACYCLINE 0.5 SENSITIVE Sensitive     VANCOMYCIN 0.25 SENSITIVE Sensitive     CLINDAMYCIN <=0.25 SENSITIVE Sensitive     * >=100,000 COLONIES/mL STREPTOCOCCUS MITIS/ORALIS  Blood culture (routine x 2)     Status: None   Collection Time: 05/23/21  9:24 AM   Specimen: BLOOD  Result Value Ref Range Status   Specimen Description BLOOD LEFT ANTECUBITAL  Final   Special Requests   Final    BOTTLES DRAWN AEROBIC AND ANAEROBIC Blood Culture results may not be optimal due to an inadequate volume of blood received in culture bottles   Culture   Final    NO GROWTH 5 DAYS Performed at [facility], [address] [place], [place] [address]    Report Status 05/28/2021 FINAL  Final  Blood culture (routine x 2)     Status: None   Collection Time: 05/23/21  9:24 AM   Specimen: BLOOD LEFT HAND  Result Value Ref Range Status   Specimen Description BLOOD LEFT HAND  Final   Special Requests   Final    BOTTLES DRAWN AEROBIC AND ANAEROBIC Blood Culture adequate  volume   Culture   Final    NO GROWTH 5 DAYS Performed at [facility], [address] [place], [place] [address]    Report Status 05/28/2021 FINAL  Final  Susceptibility, Aer + Anaerob     Status: Abnormal   Collection Time: 05/26/21  7:50 AM  Result Value Ref Range Status   Suscept, Aer + Anaerob Final report (A)  Corrected    Comment: (NOTE) Performed At: [facility] [address] [place], [place] [id number] Jolene Schimke MD OZ:[id number] CORRECTED ON 10/10 AT 1035: PREVIOUSLY REPORTED AS Preliminary report    Source of Sample ENTEROCOCCUS FAECALIS/ URINE  Final    Comment: Performed at [facility], [address], [place], [place] [address]  Susceptibility Result     Status: Abnormal   Collection Time: 05/26/21  7:50 AM  Result Value Ref Range Status   Suscept Result 1 Enterococcus faecalis (A)  Final    Comment: (NOTE) Identification performed by account, not confirmed by this laboratory.    Antimicrobial Suscept Comment  Corrected    Comment: (NOTE)      ** S = Susceptible; I = Intermediate; R = Resistant **                   P = Positive; N = Negative            MICS are expressed in micrograms per mL   Antibiotic                 RSLT#1    RSLT#2    RSLT#3    RSLT#4 Penicillin                     S Vancomycin  S Performed At: [facility] [address] [place], [place] [id number] Jolene Schimke MD VU:[id number]         LOS: 9 days   Lonia Blood, MD [facility] Office  [phone] Pager - Text Page per Amion  If 7PM-7AM, please contact night-coverage per Amion 06/01/2021, 4:59 PM

## 2021-06-01 NOTE — Progress Notes (Signed)
SLP Cancellation Note  Patient Details Name: Terrence Johnson MRN: [id number] DOB: August 22, 1941   Cancelled treatment:       Reason Eval/Treat Not Completed:  (chart reviewed; consulted NSG). NSG reported pt's status has changed to Comfort Care at this time. NSG removed and MD has ordered an oral diet. Per MD note per discussion w/ pt's Brother: "mutually agreed to transition to a comfort focused care, allowing the patient to eat with the understanding that he would likely aspirate and that this could potentially lead to respiratory issues and even death.  The patient's brother understands that his dysphagia and cognitive deficit have progressed to the point that he is not able to care for himself appropriately and not able to adequately nourish himself and that this indicates that he is in the early stages of his dying process.  His brother is interested and having him placed within a residential hospice facility, or a SNF with hospice care".   ST services will sign off at this time. MD/NSG to reconsult if any new needs arise while admitted. Recommend general aspiration precautions to include Pills given in a Puree. NSG updated.      Jerilynn Som, MS, CCC-SLP Speech Language Pathologist Rehab Services [phone] [facility] 06/01/2021, 6:12 PM

## 2021-06-02 DIAGNOSIS — R410 Disorientation, unspecified: Secondary | ICD-10-CM | POA: Diagnosis not present

## 2021-06-02 DIAGNOSIS — Z515 Encounter for palliative care: Secondary | ICD-10-CM | POA: Diagnosis not present

## 2021-06-02 DIAGNOSIS — Z7189 Other specified counseling: Secondary | ICD-10-CM | POA: Diagnosis not present

## 2021-06-02 NOTE — Progress Notes (Signed)
Terrence Johnson  MGQ:[id number] DOB: [birth year] DOA: 05/23/2021 PCP: Patient, No Pcp Per (Inactive)    Brief Narrative:  80yo group home resident with a history of COPD, chronic cognitive impairment, DM2, HLD, psychotic disorder NOS, and HTN who presented to [facility] ED 05/23/2021 via EMS from the facility in which he lives with reports of AMS.  Per ED documentation the patient suffered a fall at his facility and at some point after his fall was noted to be exhibiting slurred speech as well as difficulty ambulating. EMS was summoned and discovered a heart rate in the 30s with blood pressure of 70/50.  He was dosed with atropine and IV fluid and transported to the ED.  Upon arrival in the ED the patient remained significantly altered.  He was noted to have ecchymosis over his right side as well as bilateral knees. In the ER he was found to be markedly hypothermic with a rectal temperature of 85.  He was also noted to be COVID-positive.  CT head and cervical spine at time of presentation was without acute findings.  Significant Events:  10/2 admit via [facility] ED hypothermic, hypotensive, bradycardic - Bair hugger + IVF  10/3 recurrent symptomatic bradycardia 10/4 remains altered  10/7 beginning to improve mental status  10/8 MRI brain unremarkable  10/11 transitioned to comfort care  Date of Positive COVID Test:  05/23/21 (last day of isolation 06/02/21)   COVID-19 specific Treatment: Remdesivir 10/2 > 10/6 Hydrocortisone 10/2 > 10/6  Consultants:  [facility] Cardiology Psychiatry Neurology Gastroenterology  Code Status: NO CODE BLUE  Antimicrobials:  Zosyn 10/2 > 10/8 Vancomycin 10/2 Fosfomycin 10/9  DVT prophylaxis:  SCDs  Disposition plan: Patient requires SNF level care  Subjective: No acute events reported overnight.  Resting in bed comfortably, no complaints   Assessment & Plan:  End-of-life care Transitioned to comfort care 10/11. Palliative consulted. Likely plan will be d/c to  snf with hospice services - palliative to see  Symptomatic bradycardia -first-degree heart block with QT prolongation Likely the etiology of syncopal episodes leading to multiple recent falls -was on beta-blocker at time of admission which has been discontinued -episodic bradycardia persist but no hypotension associated with this and overall heart rate has normalized  Mildly elevated troponin -demand ischemia related to bradycardia Evaluated by cardiology with no indication for further work-up  Circulatory shock Resolved - likely due to a combination of acute infection as well as beta-blocker overdose  Multifactorial acute encephalopathy - baseline dementia  Hypothermia, bradycardia hypotension, infection all contributing -appears to have returned to his probable baseline -family reports gradual and steady cognitive decline in the past few months  Severe oral phase dysphagia likely multi-factorial -has been evaluated by by SLP - ong-term prognosis for "safe" resumption of p.o. intake felt to be quite poor and therefore short-term use of NG feedings not appropriate - not an appropriate candidate for [place]-term tube feedings, and family does not feel this would be right for him even if he was. Comfort feeds now.  Hypokalemia Due to no intake   COVID infection Treated with Remdesivir and steroid -no evidence of persisting/active infection  Thrombocytopenia Likely related to COVID - platelet count improving  DM2 CBG well controlled   Enterococcus and Streptococcus cystitis Has completed an antibiotic course  Diverticulitis of cecum Has completed an antibiotic course  Schizophrenia Psychiatry has evaluated  Disposition Palliative and hospice evals pending. Likely snf w/ hospice   Family Communication: updated brother telephonically 10/12 Status is: Inpatient  Remains inpatient  appropriate because:Inpatient level of care appropriate due to severity of illness  Dispo: The  patient is from: SNF              Anticipated d/c is to: TBD              Patient currently is medically stable to d/c.   Difficult to place patient No   Objective: Blood pressure (!) 154/74, pulse 78, temperature 98.6 F (37 C), resp. rate 18, height 5\' 7"  (1.702 m), weight 69.3 kg, SpO2 100 %.  Intake/Output Summary (Last 24 hours) at 06/02/2021 1216 Last data filed at 06/02/2021 0455 Gross per 24 hour  Intake --  Output 1075 ml  Net -1075 ml   Filed Weights   05/23/21 0747 05/23/21 1750  Weight: 68 kg 69.3 kg    Examination: General: No acute respiratory distress Lungs: Mild bibasilar crackles Cardiovascular: Regular rate and rhythm  Abdomen: Nontender, nondistended, soft, bowel sounds positive Extremities: No significant edema bilateral lower extremities  CBC: Recent Labs  Lab 05/27/21 0620 05/28/21 0521 05/29/21 0750 05/30/21 0616 06/01/21 0642  WBC 4.6   < > 6.1 8.5 7.6  NEUTROABS 3.1  --   --   --   --   HGB 10.1*   < > 9.4* 10.8* 9.5*  HCT 30.5*   < > 28.2* 32.4* 27.4*  MCV 96.2   < > 94.0 92.8 90.7  PLT 91*   < > 104* 110* 117*   < > = values in this interval not displayed.   Basic Metabolic Panel: Recent Labs  Lab 05/28/21 0521 05/29/21 0750 05/30/21 0616 06/01/21 0642  NA 142 140 143 139  K 3.5 2.9* 3.5 2.9*  CL 109 103 106 102  CO2 24 28 29 27   GLUCOSE 102* 104* 95 96  BUN 30* 25* 21 13  CREATININE 1.36* 1.26* 1.12 1.14  CALCIUM 8.9 8.7* 8.9 8.5*  MG 1.9 1.8 1.8  --   PHOS 3.5 3.0 1.8*  --    GFR: Estimated Creatinine Clearance: 49.1 mL/min (by C-G formula based on SCr of 1.14 mg/dL).   Recent Labs  Lab 05/26/21 1305  AMMONIA <10     HbA1C: Hgb A1c MFr Bld  Date/Time Value Ref Range Status  05/23/2021 06:15 PM 7.9 (H) 4.8 - 5.6 % Final    Comment:    (NOTE)         Prediabetes: 5.7 - 6.4         Diabetes: >6.4         Glycemic control for adults with diabetes: <7.0   12/16/2020 06:22 AM 6.2 (H) 4.8 - 5.6 % Final     Comment:    (NOTE) Pre diabetes:          5.7%-6.4%  Diabetes:              >6.4%  Glycemic control for   <7.0% adults with diabetes     CBG: Recent Labs  Lab 05/31/21 1152 05/31/21 1547 05/31/21 1930 05/31/21 2208 06/01/21 1053  GLUCAP 117* 140* 126* 141* 93    Recent Results (from the past 240 hour(s))  Susceptibility, Aer + Anaerob     Status: Abnormal   Collection Time: 05/26/21  7:50 AM  Result Value Ref Range Status   Suscept, Aer + Anaerob Final report (A)  Corrected    Comment: (NOTE) Performed At: [facility] 07/26/21 [address] [place], [address] [place] [id number] CORRECTED ON 10/10 AT 1035: PREVIOUSLY  REPORTED AS Preliminary report    Source of Sample ENTEROCOCCUS FAECALIS/ URINE  Final    Comment: Performed at [facility], [address] [place], [place] [address]  Susceptibility Result     Status: Abnormal   Collection Time: 05/26/21  7:50 AM  Result Value Ref Range Status   Suscept Result 1 Enterococcus faecalis (A)  Final    Comment: (NOTE) Identification performed by account, not confirmed by this laboratory.    Antimicrobial Suscept Comment  Corrected    Comment: (NOTE)      ** S = Susceptible; I = Intermediate; R = Resistant **                   P = Positive; N = Negative            MICS are expressed in micrograms per mL   Antibiotic                 RSLT#1    RSLT#2    RSLT#3    RSLT#4 Penicillin                     S Vancomycin                     S Performed At: [facility] [address] [place], [place] [id number] Jolene Schimke MD YK:[id number]         LOS: 10 days   Shonna Chock, MD [facility] Pager - Text Page per Loretha Stapler  If 7PM-7AM, please contact night-coverage per Amion 06/02/2021, 12:16 PM

## 2021-06-02 NOTE — TOC Progression Note (Addendum)
Transition of Care [facility]) - Progression Note    Patient Details  Name: Terrence Johnson MRN: [id number] Date of Birth: 12-Feb-1941  Transition of [facility]) CM/SW Contact  Maree Krabbe, LCSW Phone Number: 06/02/2021, 3:38 PM  Clinical Narrative:  MD note yesterday noted that pt's Brother indicated choice of hospice home versus SNF with hospice, depending on what pt would qualify for. CSW attempted to reach pt's brother to discuss--unable to reach him. CSW made referral to [facility] with Hospice Home to see if pt qualifies. If pt qualifies for hospice home wouldn't be able to transport until 10/16, due to COVID quarantine.    Expected Discharge Plan: Skilled Nursing Facility Barriers to Discharge: Continued Medical Work up  Expected Discharge Plan and Services Expected Discharge Plan: Skilled Nursing Facility In-house Referral: Clinical Social Work     Living arrangements for the past 2 months: Assisted Living Facility ([facility])                                       Social Determinants of Health (SDOH) Interventions    Readmission Risk Interventions No flowsheet data found.

## 2021-06-02 NOTE — Progress Notes (Signed)
Palliative: [name] is lying quietly in bed watching television.  He greets me, making but not keeping eye contact.  He is alert, oriented to situation.  I believe that he is able to make his basic needs known.  There is no family at bedside at this time.  I set him up with a lunch tray, and he is somewhat able to feed himself.  Conference with attending and transition of care team related to patient condition, needs, goals of care.  Family has elected comfort and dignity at end-of-life, residential hospice.  He is COVID-positive at this time, but will end isolation and be ready for transport to residential hospice, if accepted, on 10/16.  Conference with attending, bedside nursing staff, transition of care team related to patient condition, needs, goals of care, disposition. Medications adjusted for comfort care.  Plan: Comfort and dignity at end-of-life, residential hospice with [facility], anticipate sending 10/16. Prognosis:   2 weeks or less anticipated based on acute illness, bradycardia - first-degree heart block with QT prolongation, baseline dementia, aspiration risk 2/2 severe oral dysphagia, family's desire to focus on comfort and dignity, let nature take its course.    35 minutes  Terrence Carmel, NP Palliative medicine team Team phone [phone] Greater than 50% of this time was spent counseling and coordinating care related to the above assessment and plan.

## 2021-06-03 DIAGNOSIS — R410 Disorientation, unspecified: Secondary | ICD-10-CM | POA: Diagnosis not present

## 2021-06-03 NOTE — TOC Initial Note (Signed)
Transition of Care [facility]) - Initial/Assessment Note    Patient Details  Name: Terrence Johnson MRN: [id number] Date of Birth: [birth year]  Transition of Care [facility]) CM/SW Contact:    Allayne Butcher, RN Phone Number: 06/03/2021, 3:43 PM  Clinical Narrative:                 Patient is from a group home- [facility].  Patient's brother with medical team have decided to make patient comfort care.  Brother would like hospice home but at this time patient does not qualify for residential hospice.  RNCM will reach out to local facilities for SNF with hospice placement.  Bedside RN does report patient is 1 assist up to the chair and he can feed himself.  Bed search started.  Patient has Medicare and Medicaid.    Expected Discharge Plan: Skilled Nursing Facility Barriers to Discharge: Continued Medical Work up   Patient Goals and CMS Choice        Expected Discharge Plan and Services Expected Discharge Plan: Skilled Nursing Facility In-house Referral: Clinical Social Work     Living arrangements for the past 2 months: Assisted Living Facility ([facility])                                      Prior Living Arrangements/Services Living arrangements for the past 2 months: Assisted Living Facility ([facility])            Need for Family Participation in Patient Care: Yes (Comment) Care giver support system in place?: Yes (comment)   Criminal Activity/Legal Involvement Pertinent to Current Situation/Hospitalization: Yes - Comment as needed  Activities of Daily Living      Permission Sought/Granted Permission sought to share information with : Family Supports    Share Information with NAME: Demere, Dotzler (Brother)   [phone] (Home Phone)     Permission granted to share info w Relationship: [facility]     Emotional Assessment Appearance:: Appears older than stated age   Affect (typically observed): Unable to  Assess Orientation: : Fluctuating Orientation (Suspected and/or reported Sundowners) Alcohol / Substance Use: Not Applicable Psych Involvement: Yes (comment)  Admission diagnosis:  Diverticulitis [K57.92] RUQ pain [R10.11] Diverticulitis of cecum [K57.32] Hypothermia, initial encounter [T68.XXXA] Urinary tract infection without hematuria, site unspecified [N39.0] Altered mental status, unspecified altered mental status type [R41.82] Patient Active Problem List   Diagnosis Date Noted   Acute delirium 05/25/2021   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   Electrocardiography suggestive of ST elevation myocardial infarction (STEMI) (HCC) 05/23/2021   Diverticulitis of cecum 05/23/2021   COVID-19 virus infection 05/23/2021   Lobar pneumonia (HCC)    Hypokalemia    CKD stage 2 due to type 2 diabetes mellitus (HCC)    Acute kidney injury superimposed on CKD (HCC)    NSTEMI (non-ST elevated myocardial infarction) (HCC) 12/15/2020   Cognitive impairment    Type 2 diabetes mellitus with hyperlipidemia (HCC)    CKD (chronic kidney disease), stage IIIa    HLD (hyperlipidemia)    Hypertension    COPD (chronic obstructive pulmonary disease) (HCC)    Weakness    Onychomycosis 04/13/2020   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

## 2021-06-03 NOTE — Progress Notes (Signed)
[facility]) Hospital Liaison Note  Patient is not eligible for the Hospice Home at this time. He is eligible for hospice in facility or home with hospice. Robbie Lis, RN [facility] manager aware.  Hospital liaison will continue to follow for discharge disposition.   Please do not hesitate to call with any hospice related questions.   Thank you,   Bobbie "Einar Gip, RN, BSN [facility] Liaison [phone]

## 2021-06-03 NOTE — Progress Notes (Addendum)
Terrence Johnson  PFX:[id number] DOB: Feb 07, 1941 DOA: 05/23/2021 PCP: Patient, No Pcp Per (Inactive)    Brief Narrative:  80yo group home resident with a history of COPD, chronic cognitive impairment, DM2, HLD, psychotic disorder NOS, and HTN who presented to [facility] ED 05/23/2021 via EMS from the facility in which he lives with reports of AMS.  Per ED documentation the patient suffered a fall at his facility and at some point after his fall was noted to be exhibiting slurred speech as well as difficulty ambulating. EMS was summoned and discovered a heart rate in the 30s with blood pressure of 70/50.  He was dosed with atropine and IV fluid and transported to the ED.  Upon arrival in the ED the patient remained significantly altered.  He was noted to have ecchymosis over his right side as well as bilateral knees. In the ER he was found to be markedly hypothermic with a rectal temperature of 85.  He was also noted to be COVID-positive.  CT head and cervical spine at time of presentation was without acute findings.  Significant Events:  10/2 admit via [facility] ED hypothermic, hypotensive, bradycardic - Bair hugger + IVF  10/3 recurrent symptomatic bradycardia 10/4 remains altered  10/7 beginning to improve mental status  10/8 MRI brain unremarkable  10/11 transitioned to comfort care 10/13 awaiting hospice location determination  Date of Positive COVID Test:  05/23/21 (last day of isolation 06/02/21)   COVID-19 specific Treatment: Remdesivir 10/2 > 10/6 Hydrocortisone 10/2 > 10/6  Consultants:  [facility] Cardiology Psychiatry Neurology Gastroenterology  Code Status: NO CODE BLUE  Antimicrobials:  Zosyn 10/2 > 10/8 Vancomycin 10/2 Fosfomycin 10/9  DVT prophylaxis:  SCDs  Disposition plan: Patient requires SNF level care  Subjective: No acute events reported overnight.  Resting in bed comfortably, no complaints   Assessment & Plan:  End-of-life care Transitioned to comfort care 10/11.  Palliative consulted. Likely plan will be d/c to snf with hospice services - hospice evaluating for inpt vs outpt services  Symptomatic bradycardia -first-degree heart block with QT prolongation Likely the etiology of syncopal episodes leading to multiple recent falls -was on beta-blocker at time of admission which has been discontinued -episodic bradycardia persist but no hypotension associated with this and overall heart rate has normalized  Mildly elevated troponin -demand ischemia related to bradycardia Evaluated by cardiology with no indication for further work-up  Circulatory shock Resolved - likely due to a combination of acute infection as well as beta-blocker overdose  Multifactorial acute encephalopathy - baseline dementia  Hypothermia, bradycardia hypotension, infection all contributing -appears to have returned to his probable baseline -family reports gradual and steady cognitive decline in the past few months  Severe oral phase dysphagia likely multi-factorial -has been evaluated by by SLP - ong-term prognosis for "safe" resumption of p.o. intake felt to be quite poor and therefore short-term use of NG feedings not appropriate - not an appropriate candidate for [place]-term tube feedings, and family does not feel this would be right for him even if he was. Comfort feeds now.  Hypokalemia Due to no intake   COVID infection Treated with Remdesivir and steroid -no evidence of persisting/active infection  Thrombocytopenia Likely related to COVID - platelet count improved  DM2 CBG well controlled   Enterococcus and Streptococcus cystitis Has completed an antibiotic course  Diverticulitis of cecum Has completed an antibiotic course  Schizophrenia Psychiatry has evaluated  Disposition Palliative and hospice evals pending. Likely snf w/ hospice   Family Communication: updated  brother archie 10/12 and brother larry 10/13. Both would like regular updates Status is:  Inpatient  Remains inpatient appropriate because:Inpatient level of care appropriate due to severity of illness  Dispo: The patient is from: SNF              Anticipated d/c is to: TBD              Patient currently is medically stable to d/c.   Difficult to place patient No   Objective: Blood pressure (!) 153/72, pulse 72, temperature (!) 97.3 F (36.3 C), temperature source Oral, resp. rate 18, height 5\' 7"  (1.702 m), weight 69.3 kg, SpO2 98 %.  Intake/Output Summary (Last 24 hours) at 06/03/2021 1334 Last data filed at 06/03/2021 0830 Gross per 24 hour  Intake --  Output 1520 ml  Net -1520 ml   Filed Weights   05/23/21 0747 05/23/21 1750  Weight: 68 kg 69.3 kg    Examination: General: No acute respiratory distress Lungs: Mild bibasilar crackles Cardiovascular: Regular rate and rhythm  Abdomen: Nontender, nondistended, soft, bowel sounds positive Extremities: No significant edema bilateral lower extremities  CBC: Recent Labs  Lab 05/29/21 0750 05/30/21 0616 06/01/21 0642  WBC 6.1 8.5 7.6  HGB 9.4* 10.8* 9.5*  HCT 28.2* 32.4* 27.4*  MCV 94.0 92.8 90.7  PLT 104* 110* 117*   Basic Metabolic Panel: Recent Labs  Lab 05/28/21 0521 05/29/21 0750 05/30/21 0616 06/01/21 0642  NA 142 140 143 139  K 3.5 2.9* 3.5 2.9*  CL 109 103 106 102  CO2 24 28 29 27   GLUCOSE 102* 104* 95 96  BUN 30* 25* 21 13  CREATININE 1.36* 1.26* 1.12 1.14  CALCIUM 8.9 8.7* 8.9 8.5*  MG 1.9 1.8 1.8  --   PHOS 3.5 3.0 1.8*  --    GFR: Estimated Creatinine Clearance: 49.1 mL/min (by C-G formula based on SCr of 1.14 mg/dL).   No results for input(s): AMMONIA in the last 168 hours.    HbA1C: Hgb A1c MFr Bld  Date/Time Value Ref Range Status  05/23/2021 06:15 PM 7.9 (H) 4.8 - 5.6 % Final    Comment:    (NOTE)         Prediabetes: 5.7 - 6.4         Diabetes: >6.4         Glycemic control for adults with diabetes: <7.0   12/16/2020 06:22 AM 6.2 (H) 4.8 - 5.6 % Final    Comment:     (NOTE) Pre diabetes:          5.7%-6.4%  Diabetes:              >6.4%  Glycemic control for   <7.0% adults with diabetes     CBG: Recent Labs  Lab 05/31/21 1152 05/31/21 1547 05/31/21 1930 05/31/21 2208 06/01/21 1053  GLUCAP 117* 140* 126* 141* 93    Recent Results (from the past 240 hour(s))  Susceptibility, Aer + Anaerob     Status: Abnormal   Collection Time: 05/26/21  7:50 AM  Result Value Ref Range Status   Suscept, Aer + Anaerob Final report (A)  Corrected    Comment: (NOTE) Performed At: [facility] 07/26/21 [address] [place], [address] [place] [id number] CORRECTED ON 10/10 AT 1035: PREVIOUSLY REPORTED AS Preliminary report    Source of Sample ENTEROCOCCUS FAECALIS/ URINE  Final    Comment: Performed at [facility], [address], [place], [address] [place]  Susceptibility Result     Status: Abnormal   Collection Time: 05/26/21  7:50 AM  Result Value Ref Range Status   Suscept Result 1 Enterococcus faecalis (A)  Final    Comment: (NOTE) Identification performed by account, not confirmed by this laboratory.    Antimicrobial Suscept Comment  Corrected    Comment: (NOTE)      ** S = Susceptible; I = Intermediate; R = Resistant **                   P = Positive; N = Negative            MICS are expressed in micrograms per mL   Antibiotic                 RSLT#1    RSLT#2    RSLT#3    RSLT#4 Penicillin                     S Vancomycin                     S Performed At: [facility] [address] [place], [place] [id number] Jolene Schimke MD UX:[id number]         LOS: 11 days   Shonna Chock, MD [facility] Pager - Text Page per Loretha Stapler  If 7PM-7AM, please contact night-coverage per Amion 06/03/2021, 1:34 PM

## 2021-06-03 NOTE — [place] FL2 (Signed)
[place] MEDICAID FL2 LEVEL OF CARE SCREENING TOOL     IDENTIFICATION  Patient Name: Terrence Johnson Birthdate: [birth year] Sex: male Admission Date (Current Location): 05/23/2021  [facility] and [place] Number:  Chiropodist and Address:  [facility], [address], [place], [place] [address]      Provider Number: [id number]  Attending Physician Name and Address:  Kathrynn Running, MD  Relative Name and Phone Number:  Ori Trejos ( brother) [phone]    Current Level of Care: Hospital Recommended Level of Care: Skilled Nursing Facility Prior Approval Number:    Date Approved/Denied:   PASRR Number:    Discharge Plan: SNF    Current Diagnoses: Patient Active Problem List   Diagnosis Date Noted   Acute delirium 05/25/2021   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   Electrocardiography suggestive of ST elevation myocardial infarction (STEMI) (HCC) 05/23/2021   Diverticulitis of cecum 05/23/2021   COVID-19 virus infection 05/23/2021   Lobar pneumonia (HCC)    Hypokalemia    CKD stage 2 due to type 2 diabetes mellitus (HCC)    Acute kidney injury superimposed on CKD (HCC)    NSTEMI (non-ST elevated myocardial infarction) (HCC) 12/15/2020   Cognitive impairment    Type 2 diabetes mellitus with hyperlipidemia (HCC)    CKD (chronic kidney disease), stage IIIa    HLD (hyperlipidemia)    Hypertension    COPD (chronic obstructive pulmonary disease) (HCC)    Weakness    Onychomycosis 04/13/2020   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   SOB (shortness of breath) 03/07/2017   Schizophrenia (HCC) 02/18/2017   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   Acute cystitis 02/16/2017    Orientation RESPIRATION BLADDER Height & Weight     Self  Normal Indwelling catheter Weight: 69.3 kg Height:  5\' 7"  (170.2 cm)  BEHAVIORAL SYMPTOMS/MOOD NEUROLOGICAL BOWEL NUTRITION STATUS      Incontinent Diet (see discharge summary)  AMBULATORY STATUS COMMUNICATION OF  NEEDS Skin   Limited Assist Verbally Bruising (bruising to arms)                       Personal Care Assistance Level of Assistance  Bathing, Feeding, Dressing Bathing Assistance: Maximum assistance Feeding assistance: Limited assistance Dressing Assistance: Limited assistance     Functional Limitations Info  Sight, Hearing, Speech Sight Info: Adequate Hearing Info: Adequate Speech Info: Adequate    SPECIAL CARE FACTORS FREQUENCY                       Contractures Contractures Info: Not present    Additional Factors Info  Code Status, Allergies Code Status Info: DNR Allergies Info: Aricept           Current Medications (06/03/2021):  This is the current hospital active medication list Current Facility-Administered Medications  Medication Dose Route Frequency Provider Last Rate Last Admin   acetaminophen (TYLENOL) tablet 650 mg  650 mg Oral Q6H PRN 06/05/2021, MD       Or   acetaminophen (TYLENOL) suppository 650 mg  650 mg Rectal Q6H PRN Lonia Blood, MD       antiseptic oral rinse (BIOTENE) solution 15 mL  15 mL Topical PRN Lonia Blood, MD       diphenhydrAMINE (BENADRYL) injection 12.5 mg  12.5 mg Intravenous Q4H PRN Lonia Blood, MD       haloperidol (HALDOL) tablet 0.5 mg  0.5 mg Oral Q4H PRN Lonia Blood,  MD       Or   haloperidol (HALDOL) 2 MG/ML solution 0.5 mg  0.5 mg Sublingual Q4H PRN Lonia Blood, MD       Or   haloperidol lactate (HALDOL) injection 0.5 mg  0.5 mg Intravenous Q4H PRN Lonia Blood, MD       LORazepam (ATIVAN) tablet 1 mg  1 mg Oral Q4H PRN Lonia Blood, MD       Or   LORazepam (ATIVAN) 2 MG/ML concentrated solution 1 mg  1 mg Sublingual Q4H PRN Lonia Blood, MD       Or   LORazepam (ATIVAN) injection 1 mg  1 mg Intravenous Q4H PRN Lonia Blood, MD       morphine 2 MG/ML injection 1-2 mg  1-2 mg Intravenous Q1H PRN Lonia Blood, MD       morphine 2 MG/ML  injection 1-2 mg  1-2 mg Subcutaneous Q1H PRN Lonia Blood, MD       morphine CONCENTRATE 10 MG/0.5ML oral solution 5 mg  5 mg Oral Q2H PRN Lonia Blood, MD       Or   morphine CONCENTRATE 10 MG/0.5ML oral solution 5 mg  5 mg Sublingual Q2H PRN Lonia Blood, MD       ondansetron (ZOFRAN-ODT) disintegrating tablet 4 mg  4 mg Oral Q6H PRN Lonia Blood, MD       Or   ondansetron [facility]) injection 4 mg  4 mg Intravenous Q6H PRN Lonia Blood, MD       senna (SENOKOT) tablet 8.6 mg  1 tablet Oral QHS PRN Lonia Blood, MD         Discharge Medications: Please see discharge summary for a list of discharge medications.  Relevant Imaging Results:  Relevant Lab Results:   Additional Information SSN: [id number]  Allayne Butcher, RN

## 2021-06-04 DIAGNOSIS — R410 Disorientation, unspecified: Secondary | ICD-10-CM | POA: Diagnosis not present

## 2021-06-04 DIAGNOSIS — R1311 Dysphagia, oral phase: Secondary | ICD-10-CM

## 2021-06-04 LAB — HEPATITIS PANEL, ACUTE
Hep A IgM: NONREACTIVE
Hep B C IgM: NONREACTIVE
Hepatitis B Surface Ag: NONREACTIVE

## 2021-06-04 NOTE — TOC Progression Note (Signed)
Transition of Care [facility]) - Progression Note    Patient Details  Name: Terrence Johnson MRN: [id number] Date of Birth: October 23, 1940  [facility]) CM/SW Contact  Allayne Butcher, RN Phone Number: 06/04/2021, 2:33 PM  Clinical Narrative:    No bed offers at this time.  TOC will follow up on Monday and extend bed search if needed.     Expected Discharge Plan: Skilled Nursing Facility Barriers to Discharge: Continued Medical Work up  Expected Discharge Plan and Services Expected Discharge Plan: Skilled Nursing Facility In-house Referral: Clinical Social Work     Living arrangements for the past 2 months: Assisted Living Facility ([facility])                                       Social Determinants of Health (SDOH) Interventions    Readmission Risk Interventions No flowsheet data found.

## 2021-06-04 NOTE — Progress Notes (Signed)
Palliative: [name] is lying quietly in bed.  He appears acutely/chronically ill and quite frail.  I believe that he is able to make his basic needs known if asked.  There is no family at bedside at this time.  Ask if I can get him anything and he tells me he would like something to drink.  I give him water, and he is able to hold the cup.  He does have clear signs of aspiration.  Brother, Archie Cockerell, has elected comfort care due to Terrence Johnson chronic illness and debility, severe oropharyngeal aspiration risk.  Unfortunately, Terrence Johnson does not qualify for residential hospice at this point.  Transition of care team is working for placement with continued hospice care.  Conference with transition of care team and inpatient hospice representative related to patient condition, needs, goals of care, disposition.  Plan: Comfort and dignity at end-of-life, requesting residential hospice.  If Terrence Johnson has an acute episode of aspiration, this would likely change his eligibility for residential hospice. Prognosis: 2 weeks or less anticipated based on acute illness, bradycardia with first-degree heart block with QT prolongation, baseline dementia, aspiration risks 2/2 severe oral dysphagia, family's desire to focus on comfort and dignity, let nature take its course.     25 minutes  Lillia Carmel, NP Palliative medicine team Team phone [phone] Greater than 50% of this time was spent counseling and coordinating care related to the above assessment and plan.

## 2021-06-04 NOTE — Progress Notes (Addendum)
Terrence Johnson  HCW:[id number] DOB: March 03, 1941 DOA: 05/23/2021 PCP: Patient, No Pcp Per (Inactive)    Brief Narrative:  80yo group home resident with a history of COPD, chronic cognitive impairment, DM2, HLD, psychotic disorder NOS, and HTN who presented to [facility] ED 05/23/2021 via EMS from the facility in which he lives with reports of AMS.  Per ED documentation the patient suffered a fall at his facility and at some point after his fall was noted to be exhibiting slurred speech as well as difficulty ambulating. EMS was summoned and discovered a heart rate in the 30s with blood pressure of 70/50.  He was dosed with atropine and IV fluid and transported to the ED.  Upon arrival in the ED the patient remained significantly altered.  He was noted to have ecchymosis over his right side as well as bilateral knees. In the ER he was found to be markedly hypothermic with a rectal temperature of 85.  He was also noted to be COVID-positive.  CT head and cervical spine at time of presentation was without acute findings.  Significant Events:  10/2 admit via [facility] ED hypothermic, hypotensive, bradycardic - Bair hugger + IVF  10/3 recurrent symptomatic bradycardia 10/4 remains altered  10/7 beginning to improve mental status  10/8 MRI brain unremarkable  10/11 transitioned to comfort care 10/13 awaiting hospice location determination 10/14 declined inpatient hospice; toc looking for snf bed  Date of Positive COVID Test:  05/23/21 (last day of isolation 06/02/21)   COVID-19 specific Treatment: Remdesivir 10/2 > 10/6 Hydrocortisone 10/2 > 10/6  Consultants:  [facility] Cardiology Psychiatry Neurology Gastroenterology  Code Status: NO CODE BLUE  Antimicrobials:  Zosyn 10/2 > 10/8 Vancomycin 10/2 Fosfomycin 10/9  DVT prophylaxis:  SCDs  Disposition plan: Patient requires SNF level care  Subjective: No acute events reported overnight.  Resting in bed comfortably, no complaints   Assessment &  Plan:  End-of-life care Transitioned to comfort care 10/11. Palliative consulted. Likely plan will be d/c to snf with hospice services - toc working on placement  Symptomatic bradycardia -first-degree heart block with QT prolongation Likely the etiology of syncopal episodes leading to multiple recent falls -was on beta-blocker at time of admission which has been discontinued -episodic bradycardia persist but no hypotension associated with this and overall heart rate has normalized  Mildly elevated troponin -demand ischemia related to bradycardia Evaluated by cardiology with no indication for further work-up  Circulatory shock Resolved - likely due to a combination of acute infection as well as beta-blocker overdose  Multifactorial acute encephalopathy - baseline dementia  Hypothermia, bradycardia hypotension, infection all contributing -appears to have returned to his probable baseline -family reports gradual and steady cognitive decline in the past few months  Severe oral phase dysphagia likely multi-factorial -has been evaluated by by SLP - ong-term prognosis for "safe" resumption of p.o. intake felt to be quite poor and therefore short-term use of NG feedings not appropriate - not an appropriate candidate for [place]-term tube feedings, and family does not feel this would be right for him even if he was. Comfort feeds now.  Hypokalemia Due to no intake   COVID infection Treated with Remdesivir and steroid -no evidence of persisting/active infection  Thrombocytopenia Likely related to COVID - platelet count improved prior to stopping lab draws  DM2 CBG well controlled prior to stopping checks  Enterococcus and Streptococcus cystitis Has completed an antibiotic course  Diverticulitis of cecum Has completed an antibiotic course  Schizophrenia Psychiatry has evaluated  Family Communication: updated brother archie 10/12 and brother larry 10/13. No answer when attempted to call  Archie on 10/14. Both would like regular updates Status is: Inpatient  Remains inpatient appropriate because:Inpatient level of care appropriate due to severity of illness  Dispo: The patient is from: SNF              Anticipated d/c is to: snf              Patient currently is medically stable to d/c.   Difficult to place patient No   Objective: Blood pressure (!) 171/55, pulse 82, temperature 97.9 F (36.6 C), temperature source Oral, resp. rate 16, height 5\' 7"  (1.702 m), weight 69.3 kg, SpO2 100 %.  Intake/Output Summary (Last 24 hours) at 06/04/2021 1350 Last data filed at 06/04/2021 0500 Gross per 24 hour  Intake --  Output 1300 ml  Net -1300 ml   Filed Weights   05/23/21 0747 05/23/21 1750  Weight: 68 kg 69.3 kg    Examination: General: No acute respiratory distress Lungs: Mild bibasilar crackles Cardiovascular: Regular rate and rhythm  Abdomen: Nontender, nondistended, soft, bowel sounds positive Extremities: No significant edema bilateral lower extremities  CBC: Recent Labs  Lab 05/29/21 0750 05/30/21 0616 06/01/21 0642  WBC 6.1 8.5 7.6  HGB 9.4* 10.8* 9.5*  HCT 28.2* 32.4* 27.4*  MCV 94.0 92.8 90.7  PLT 104* 110* 117*   Basic Metabolic Panel: Recent Labs  Lab 05/29/21 0750 05/30/21 0616 06/01/21 0642  NA 140 143 139  K 2.9* 3.5 2.9*  CL 103 106 102  CO2 28 29 27   GLUCOSE 104* 95 96  BUN 25* 21 13  CREATININE 1.26* 1.12 1.14  CALCIUM 8.7* 8.9 8.5*  MG 1.8 1.8  --   PHOS 3.0 1.8*  --    GFR: Estimated Creatinine Clearance: 49.1 mL/min (by C-G formula based on SCr of 1.14 mg/dL).   No results for input(s): AMMONIA in the last 168 hours.    HbA1C: Hgb A1c MFr Bld  Date/Time Value Ref Range Status  05/23/2021 06:15 PM 7.9 (H) 4.8 - 5.6 % Final    Comment:    (NOTE)         Prediabetes: 5.7 - 6.4         Diabetes: >6.4         Glycemic control for adults with diabetes: <7.0   12/16/2020 06:22 AM 6.2 (H) 4.8 - 5.6 % Final     Comment:    (NOTE) Pre diabetes:          5.7%-6.4%  Diabetes:              >6.4%  Glycemic control for   <7.0% adults with diabetes     CBG: Recent Labs  Lab 05/31/21 1152 05/31/21 1547 05/31/21 1930 05/31/21 2208 06/01/21 1053  GLUCAP 117* 140* 126* 141* 93    Recent Results (from the past 240 hour(s))  Susceptibility, Aer + Anaerob     Status: Abnormal   Collection Time: 05/26/21  7:50 AM  Result Value Ref Range Status   Suscept, Aer + Anaerob Final report (A)  Corrected    Comment: (NOTE) Performed At: [facility] 07/26/21 [address] [place], [address] [place] [id number] CORRECTED ON 10/10 AT 1035: PREVIOUSLY REPORTED AS Preliminary report    Source of Sample ENTEROCOCCUS FAECALIS/ URINE  Final    Comment: Performed at [facility], [address], [place], [address] [place]  Susceptibility  Result     Status: Abnormal   Collection Time: 05/26/21  7:50 AM  Result Value Ref Range Status   Suscept Result 1 Enterococcus faecalis (A)  Final    Comment: (NOTE) Identification performed by account, not confirmed by this laboratory.    Antimicrobial Suscept Comment  Corrected    Comment: (NOTE)      ** S = Susceptible; I = Intermediate; R = Resistant **                   P = Positive; N = Negative            MICS are expressed in micrograms per mL   Antibiotic                 RSLT#1    RSLT#2    RSLT#3    RSLT#4 Penicillin                     S Vancomycin                     S Performed At: [facility] [address] [place], [place] [id number] Jolene Schimke MD SK:[id number]         LOS: 12 days   Shonna Chock, MD [facility] Pager - Text Page per [facility]  If 7PM-7AM, please contact night-coverage per Amion 06/04/2021, 1:50 PM

## 2021-06-04 NOTE — Progress Notes (Signed)
Civil engineer, contracting hospital Liaison note:  Follow up conversation with TOC Robbie Lis. No SNF bed offers at this time. Plan continues for discharge to SNF with hospice services. Hospital Liaison to reassess for Hospice Home appropriateness on Monday. Hospital team updated. Thank you. Dayna Barker BSN, RN, [facility] [employer] [phone]

## 2021-06-05 DIAGNOSIS — R410 Disorientation, unspecified: Secondary | ICD-10-CM | POA: Diagnosis not present

## 2021-06-05 NOTE — Progress Notes (Addendum)
Terrence Johnson  ZOX:[id number] DOB: 23-Mar-1941 DOA: 05/23/2021 PCP: Patient, No Pcp Per (Inactive)    Brief Narrative:  80yo group home resident with a history of COPD, chronic cognitive impairment, DM2, HLD, psychotic disorder NOS, and HTN who presented to [facility] ED 05/23/2021 via EMS from the facility in which he lives with reports of AMS.  Per ED documentation the patient suffered a fall at his facility and at some point after his fall was noted to be exhibiting slurred speech as well as difficulty ambulating. EMS was summoned and discovered a heart rate in the 30s with blood pressure of 70/50.  He was dosed with atropine and IV fluid and transported to the ED.  Upon arrival in the ED the patient remained significantly altered.  He was noted to have ecchymosis over his right side as well as bilateral knees. In the ER he was found to be markedly hypothermic with a rectal temperature of 85.  He was also noted to be COVID-positive.  CT head and cervical spine at time of presentation was without acute findings.  Significant Events:  10/2 admit via [facility] ED hypothermic, hypotensive, bradycardic - Bair hugger + IVF  10/3 recurrent symptomatic bradycardia 10/4 remains altered  10/7 beginning to improve mental status  10/8 MRI brain unremarkable  10/11 transitioned to comfort care 10/13 awaiting hospice location determination 10/14 declined inpatient hospice; toc looking for snf bed  Date of Positive COVID Test:  05/23/21 (last day of isolation 06/02/21)   COVID-19 specific Treatment: Remdesivir 10/2 > 10/6 Hydrocortisone 10/2 > 10/6  Consultants:  [facility] Cardiology Psychiatry Neurology Gastroenterology  Code Status: NO CODE BLUE  Antimicrobials:  Zosyn 10/2 > 10/8 Vancomycin 10/2 Fosfomycin 10/9  DVT prophylaxis:  SCDs  Disposition plan: Patient requires SNF level care  Subjective: No acute events reported overnight.  Resting in bed comfortably, no complaints   Assessment &  Plan:  End-of-life care Transitioned to comfort care 10/11. Palliative consulted. Likely plan will be d/c to snf with hospice services - toc working on placement, no bed offers yet  Symptomatic bradycardia -first-degree heart block with QT prolongation Likely the etiology of syncopal episodes leading to multiple recent falls -was on beta-blocker at time of admission which has been discontinued -episodic bradycardia persist but no hypotension associated with this and overall heart rate has normalized  Mildly elevated troponin -demand ischemia related to bradycardia Evaluated by cardiology with no indication for further work-up  Circulatory shock Resolved - likely due to a combination of acute infection as well as beta-blocker overdose  Multifactorial acute encephalopathy - baseline dementia  Hypothermia, bradycardia hypotension, infection all contributing -appears to have returned to his probable baseline -family reports gradual and steady cognitive decline in the past few months  Severe oral phase dysphagia likely multi-factorial -has been evaluated by by SLP - ong-term prognosis for "safe" resumption of p.o. intake felt to be quite poor and therefore short-term use of NG feedings not appropriate - not an appropriate candidate for [place]am-term tube feedings, and family does not feel this would be right for him even if he was. Comfort feeds now.  Hypokalemia Due to no intake   COVID infection Treated with Remdesivir and steroid -no evidence of persisting/active infection  Thrombocytopenia Likely related to COVID - platelet count improved prior to stopping lab draws  DM2 CBG well controlled prior to stopping checks  Enterococcus and Streptococcus cystitis Has completed an antibiotic course  Diverticulitis of cecum Has completed an antibiotic course  Schizophrenia Psychiatry  has evaluated. calm   Family Communication: updated brother archie 10/15, will update again Monday if all  stable tomorrow.  Status is: Inpatient  Remains inpatient appropriate because:Inpatient level of care appropriate due to severity of illness  Dispo: The patient is from: SNF              Anticipated d/c is to: snf              Patient currently is medically stable to d/c.   Difficult to place patient No   Objective: Blood pressure (!) 169/81, pulse 94, temperature 98.7 F (37.1 C), temperature source Oral, resp. rate 16, height 5\' 7"  (1.702 m), weight 69.3 kg, SpO2 99 %.  Intake/Output Summary (Last 24 hours) at 06/05/2021 1344 Last data filed at 06/05/2021 0710 Gross per 24 hour  Intake 360 ml  Output 1300 ml  Net -940 ml   Filed Weights   05/23/21 0747 05/23/21 1750  Weight: 68 kg 69.3 kg    Examination: General: No acute respiratory distress Lungs: Mild bibasilar crackles Cardiovascular: Regular rate and rhythm  Abdomen: Nontender, nondistended, soft, bowel sounds positive Extremities: No significant edema bilateral lower extremities  CBC: Recent Labs  Lab 05/30/21 0616 06/01/21 0642  WBC 8.5 7.6  HGB 10.8* 9.5*  HCT 32.4* 27.4*  MCV 92.8 90.7  PLT 110* 117*   Basic Metabolic Panel: Recent Labs  Lab 05/30/21 0616 06/01/21 0642  NA 143 139  K 3.5 2.9*  CL 106 102  CO2 29 27  GLUCOSE 95 96  BUN 21 13  CREATININE 1.12 1.14  CALCIUM 8.9 8.5*  MG 1.8  --   PHOS 1.8*  --    GFR: Estimated Creatinine Clearance: 49.1 mL/min (by C-G formula based on SCr of 1.14 mg/dL).   No results for input(s): AMMONIA in the last 168 hours.    HbA1C: Hgb A1c MFr Bld  Date/Time Value Ref Range Status  05/23/2021 06:15 PM 7.9 (H) 4.8 - 5.6 % Final    Comment:    (NOTE)         Prediabetes: 5.7 - 6.4         Diabetes: >6.4         Glycemic control for adults with diabetes: <7.0   12/16/2020 06:22 AM 6.2 (H) 4.8 - 5.6 % Final    Comment:    (NOTE) Pre diabetes:          5.7%-6.4%  Diabetes:              >6.4%  Glycemic control for   <7.0% adults with  diabetes     CBG: Recent Labs  Lab 05/31/21 1152 05/31/21 1547 05/31/21 1930 05/31/21 2208 06/01/21 1053  GLUCAP 117* 140* 126* 141* 93    No results found for this or any previous visit (from the past 240 hour(s)).       LOS: 13 days   08/01/21, MD [facility] Pager - Text Page per Shonna Chock  If 7PM-7AM, please contact night-coverage per Amion 06/05/2021, 1:44 PM

## 2021-06-06 DIAGNOSIS — R410 Disorientation, unspecified: Secondary | ICD-10-CM | POA: Diagnosis not present

## 2021-06-06 NOTE — Progress Notes (Signed)
Terrence Johnson  PPI:[id number] DOB: Feb 09, 1941 DOA: 05/23/2021 PCP: Patient, No Pcp Per (Inactive)    Brief Narrative:  80yo group home resident with a history of COPD, chronic cognitive impairment, DM2, HLD, psychotic disorder NOS, and HTN who presented to [facility] ED 05/23/2021 via EMS from the facility in which he lives with reports of AMS.  Per ED documentation the patient suffered a fall at his facility and at some point after his fall was noted to be exhibiting slurred speech as well as difficulty ambulating. EMS was summoned and discovered a heart rate in the 30s with blood pressure of 70/50.  He was dosed with atropine and IV fluid and transported to the ED.  Upon arrival in the ED the patient remained significantly altered.  He was noted to have ecchymosis over his right side as well as bilateral knees. In the ER he was found to be markedly hypothermic with a rectal temperature of 85.  He was also noted to be COVID-positive.  CT head and cervical spine at time of presentation was without acute findings.  Significant Events:  10/2 admit via [facility] ED hypothermic, hypotensive, bradycardic - Bair hugger + IVF  10/3 recurrent symptomatic bradycardia 10/4 remains altered  10/7 beginning to improve mental status  10/8 MRI brain unremarkable  10/11 transitioned to comfort care 10/13 awaiting hospice location determination 10/14 declined inpatient hospice; toc looking for snf bed  Date of Positive COVID Test:  05/23/21 (last day of isolation 06/02/21)   COVID-19 specific Treatment: Remdesivir 10/2 > 10/6 Hydrocortisone 10/2 > 10/6  Consultants:  [facility] Cardiology Psychiatry Neurology Gastroenterology  Code Status: NO CODE BLUE  Antimicrobials:  Zosyn 10/2 > 10/8 Vancomycin 10/2 Fosfomycin 10/9  DVT prophylaxis:  SCDs  Disposition plan: Patient requires SNF level care  Subjective: No acute events reported overnight.  Resting in bed comfortably, no complaints   Assessment &  Plan:  End-of-life care Transitioned to comfort care 10/11. Palliative consulted. Likely plan will be d/c to snf with hospice services - toc working on placement, no bed offers yet  Symptomatic bradycardia -first-degree heart block with QT prolongation Likely the etiology of syncopal episodes leading to multiple recent falls -was on beta-blocker at time of admission which has been discontinued -episodic bradycardia persist but no hypotension associated with this and overall heart rate has normalized  Mildly elevated troponin -demand ischemia related to bradycardia Evaluated by cardiology with no indication for further work-up  Circulatory shock Resolved - likely due to a combination of acute infection as well as beta-blocker overdose  Multifactorial acute encephalopathy - baseline dementia  Hypothermia, bradycardia hypotension, infection all contributing -appears to have returned to his probable baseline -family reports gradual and steady cognitive decline in the past few months  Severe oral phase dysphagia likely multi-factorial -has been evaluated by by SLP - ong-term prognosis for "safe" resumption of p.o. intake felt to be quite poor and therefore short-term use of NG feedings not appropriate - not an appropriate candidate for [place]o-term tube feedings, and family does not feel this would be right for him even if he was. Comfort feeds now.  Hypokalemia Due to no intake   COVID infection Treated with Remdesivir and steroid -no evidence of persisting/active infection  Thrombocytopenia Likely related to COVID - platelet count improved prior to stopping lab draws  DM2 CBG well controlled prior to stopping checks  Enterococcus and Streptococcus cystitis Has completed an antibiotic course  Diverticulitis of cecum Has completed an antibiotic course  Schizophrenia Psychiatry  has evaluated. calm   Family Communication: updated brother archie 10/15, will update again tomorrow.   Status is: Inpatient  Remains inpatient appropriate because:Inpatient level of care appropriate due to severity of illness  Dispo: The patient is from: SNF              Anticipated d/c is to: snf              Patient currently is medically stable to d/c.   Difficult to place patient No   Objective: Blood pressure (!) 157/69, pulse 84, temperature 98.1 F (36.7 C), temperature source Oral, resp. rate 20, height 5\' 7"  (1.702 m), weight 69.3 kg, SpO2 97 %.  Intake/Output Summary (Last 24 hours) at 06/06/2021 1351 Last data filed at 06/06/2021 0432 Gross per 24 hour  Intake 237 ml  Output 800 ml  Net -563 ml   Filed Weights   05/23/21 0747 05/23/21 1750  Weight: 68 kg 69.3 kg    Examination: General: No acute respiratory distress Lungs: Mild bibasilar crackles Cardiovascular: Regular rate and rhythm  Abdomen: Nontender, nondistended, soft, bowel sounds positive Extremities: No significant edema bilateral lower extremities  CBC: Recent Labs  Lab 06/01/21 0642  WBC 7.6  HGB 9.5*  HCT 27.4*  MCV 90.7  PLT 117*   Basic Metabolic Panel: Recent Labs  Lab 06/01/21 0642  NA 139  K 2.9*  CL 102  CO2 27  GLUCOSE 96  BUN 13  CREATININE 1.14  CALCIUM 8.5*   GFR: Estimated Creatinine Clearance: 49.1 mL/min (by C-G formula based on SCr of 1.14 mg/dL).   No results for input(s): AMMONIA in the last 168 hours.    HbA1C: Hgb A1c MFr Bld  Date/Time Value Ref Range Status  05/23/2021 06:15 PM 7.9 (H) 4.8 - 5.6 % Final    Comment:    (NOTE)         Prediabetes: 5.7 - 6.4         Diabetes: >6.4         Glycemic control for adults with diabetes: <7.0   12/16/2020 06:22 AM 6.2 (H) 4.8 - 5.6 % Final    Comment:    (NOTE) Pre diabetes:          5.7%-6.4%  Diabetes:              >6.4%  Glycemic control for   <7.0% adults with diabetes     CBG: Recent Labs  Lab 05/31/21 1152 05/31/21 1547 05/31/21 1930 05/31/21 2208 06/01/21 1053  GLUCAP 117* 140* 126*  141* 93    No results found for this or any previous visit (from the past 240 hour(s)).       LOS: 14 days   08/01/21, MD [facility] Pager - Text Page per Shonna Chock  If 7PM-7AM, please contact night-coverage per Amion 06/06/2021, 1:51 PM

## 2021-06-07 DIAGNOSIS — R410 Disorientation, unspecified: Secondary | ICD-10-CM | POA: Diagnosis not present

## 2021-06-07 NOTE — Progress Notes (Signed)
[facility]) Hospital Liaison Note  Visited patient at bedside. He was awake and alert. He had eaten approximately half of his breakfast. Spoke with [facility] manager Robbie Lis, RN. Plan is for discharge to SNF with hospice services or with palliative care.   Hospital liaison will continue to follow through disposition. If patient declines while awaiting placement in SNF, will reassess for Hospice Home. At this time, patient remains ineligible for Hospice Home.   Please do not hesitate to call with any hospice related questions.  Thank you,   Bobbie "Einar Gip, RN, BSN [facility] Liaison [phone]

## 2021-06-07 NOTE — Progress Notes (Addendum)
Terrence Johnson  ION:[id number] DOB: [birth year] DOA: 05/23/2021 PCP: Patient, No Pcp Per (Inactive)    Brief Narrative:  80yo group home resident with a history of COPD, chronic cognitive impairment, DM2, HLD, psychotic disorder NOS, and HTN who presented to [facility] ED 05/23/2021 via EMS from the facility in which he lives with reports of AMS.  Per ED documentation the patient suffered a fall at his facility and at some point after his fall was noted to be exhibiting slurred speech as well as difficulty ambulating. EMS was summoned and discovered a heart rate in the 30s with blood pressure of 70/50.  He was dosed with atropine and IV fluid and transported to the ED.  Upon arrival in the ED the patient remained significantly altered.  He was noted to have ecchymosis over his right side as well as bilateral knees. In the ER he was found to be markedly hypothermic with a rectal temperature of 85.  He was also noted to be COVID-positive.  CT head and cervical spine at time of presentation was without acute findings.  Significant Events:  10/2 admit via [facility] ED hypothermic, hypotensive, bradycardic - Bair hugger + IVF  10/3 recurrent symptomatic bradycardia 10/4 remains altered  10/7 beginning to improve mental status  10/8 MRI brain unremarkable  10/11 transitioned to comfort care 10/13 awaiting hospice location determination 10/14 declined inpatient hospice; toc looking for snf bed  Date of Positive COVID Test:  05/23/21 (last day of isolation 06/02/21)   COVID-19 specific Treatment: Remdesivir 10/2 > 10/6 Hydrocortisone 10/2 > 10/6  Consultants:  [facility] Cardiology Psychiatry Neurology Gastroenterology  Code Status: NO CODE BLUE  Antimicrobials:  Zosyn 10/2 > 10/8 Vancomycin 10/2 Fosfomycin 10/9  DVT prophylaxis:  SCDs  Disposition plan: Patient requires SNF level care  Subjective: No acute events reported overnight.  Resting in bed comfortably, no complaints   Assessment &  Plan:  End-of-life care Transitioned to comfort care 10/11. Palliative consulted. Likely plan will be d/c to snf with hospice services - toc working on placement, no bed offers yet. Bed search extended today  Symptomatic bradycardia -first-degree heart block with QT prolongation Likely the etiology of syncopal episodes leading to multiple recent falls -was on beta-blocker at time of admission which has been discontinued -episodic bradycardia persist but no hypotension associated with this and overall heart rate has normalized  Mildly elevated troponin -demand ischemia related to bradycardia Evaluated by cardiology with no indication for further work-up  Circulatory shock Resolved - likely due to a combination of acute infection as well as beta-blocker overdose  Multifactorial acute encephalopathy - baseline dementia  Hypothermia, bradycardia hypotension, infection all contributing -appears to have returned to his probable baseline -family reports gradual and steady cognitive decline in the past few months  Severe oral phase dysphagia likely multi-factorial -has been evaluated by by SLP - ong-term prognosis for "safe" resumption of p.o. intake felt to be quite poor and therefore short-term use of NG feedings not appropriate - not an appropriate candidate for Cimo-term tube feedings, and family does not feel this would be right for him even if he was. Comfort feeds now.  Hypokalemia Due to no intake   COVID infection Sepsis Treated with Remdesivir and steroid -no evidence of persisting/active infection. Sepisis (hypothermia, hypotension) physiology resolved  Thrombocytopenia Likely related to COVID - platelet count improved prior to stopping lab draws  DM2 CBG well controlled prior to stopping checks  Enterococcus and Streptococcus cystitis Has completed an antibiotic course  Diverticulitis  of cecum Has completed an antibiotic course  Schizophrenia Psychiatry has evaluated.  calm   Family Communication: updated brother larry on 10/17. Will update brother archie tomorrow Status is: Inpatient  Remains inpatient appropriate because:Inpatient level of care appropriate due to severity of illness  Dispo: The patient is from: SNF              Anticipated d/c is to: snf              Patient currently is medically stable to d/c.   Difficult to place patient No   Objective: Blood pressure (!) 164/67, pulse 89, temperature 97.8 F (36.6 C), temperature source Oral, resp. rate 16, height 5\' 7"  (1.702 m), weight 69.3 kg, SpO2 98 %.  Intake/Output Summary (Last 24 hours) at 06/07/2021 1346 Last data filed at 06/07/2021 0641 Gross per 24 hour  Intake 480 ml  Output 2050 ml  Net -1570 ml   Filed Weights   05/23/21 0747 05/23/21 1750  Weight: 68 kg 69.3 kg    Examination: General: No acute respiratory distress Lungs: Mild bibasilar crackles Cardiovascular: Regular rate and rhythm  Abdomen: Nontender, nondistended, soft, bowel sounds positive Extremities: No significant edema bilateral lower extremities  CBC: Recent Labs  Lab 06/01/21 0642  WBC 7.6  HGB 9.5*  HCT 27.4*  MCV 90.7  PLT 117*   Basic Metabolic Panel: Recent Labs  Lab 06/01/21 0642  NA 139  K 2.9*  CL 102  CO2 27  GLUCOSE 96  BUN 13  CREATININE 1.14  CALCIUM 8.5*   GFR: Estimated Creatinine Clearance: 49.1 mL/min (by C-G formula based on SCr of 1.14 mg/dL).   No results for input(s): AMMONIA in the last 168 hours.    HbA1C: Hgb A1c MFr Bld  Date/Time Value Ref Range Status  05/23/2021 06:15 PM 7.9 (H) 4.8 - 5.6 % Final    Comment:    (NOTE)         Prediabetes: 5.7 - 6.4         Diabetes: >6.4         Glycemic control for adults with diabetes: <7.0   12/16/2020 06:22 AM 6.2 (H) 4.8 - 5.6 % Final    Comment:    (NOTE) Pre diabetes:          5.7%-6.4%  Diabetes:              >6.4%  Glycemic control for   <7.0% adults with diabetes     CBG: Recent Labs   Lab 05/31/21 1547 05/31/21 1930 05/31/21 2208 06/01/21 1053  GLUCAP 140* 126* 141* 93    No results found for this or any previous visit (from the past 240 hour(s)).       LOS: 15 days   08/01/21, MD [facility] Pager - Text Page per Shonna Chock  If 7PM-7AM, please contact night-coverage per Amion 06/07/2021, 1:46 PM

## 2021-06-07 NOTE — TOC Progression Note (Signed)
Transition of Care [facility]) - Progression Note    Patient Details  Name: Terrence Johnson MRN: [id number] Date of Birth: September 06, 1940  Transition of Care [facility]) CM/SW Contact  Allayne Butcher, RN Phone Number: 06/07/2021, 9:28 AM  Clinical Narrative:    Bed search extended, no bed offers at this time.   Expected Discharge Plan: Skilled Nursing Facility Barriers to Discharge: Continued Medical Work up  Expected Discharge Plan and Services Expected Discharge Plan: Skilled Nursing Facility In-house Referral: Clinical Social Work     Living arrangements for the past 2 months: Assisted Living Facility ([facility])                                       Social Determinants of Health (SDOH) Interventions    Readmission Risk Interventions No flowsheet data found.

## 2021-06-08 DIAGNOSIS — R410 Disorientation, unspecified: Secondary | ICD-10-CM | POA: Diagnosis not present

## 2021-06-08 NOTE — TOC Progression Note (Signed)
Transition of Care [facility]) - Progression Note    Patient Details  Name: Terrence Johnson MRN: [id number] Date of Birth: June 01, 1941  Transition of Care [facility]) CM/SW Contact  Allayne Butcher, RN Phone Number: 06/08/2021, 4:15 PM  Clinical Narrative:    [facility] will offer a bed to patient but they would like to bring him in under Medicare for Rehab and then transition to [facility] with hospice.   Brother Briscoe Burns is in agreement with this plan.  Passr is pending.     Expected Discharge Plan: Skilled Nursing Facility Barriers to Discharge: Continued Medical Work up  Expected Discharge Plan and Services Expected Discharge Plan: Skilled Nursing Facility In-house Referral: Clinical Social Work     Living arrangements for the past 2 months: Assisted Living Facility ([facility])                                       Social Determinants of Health (SDOH) Interventions    Readmission Risk Interventions No flowsheet data found.

## 2021-06-08 NOTE — Progress Notes (Signed)
[facility]) Hospital Liaison Note  Visited patient at bedside. Patient awake and alert. Denies pain. Continues to await placement in skilled nursing facility. Hospital liaison will continue to follow for disposition.   Please do not hesitate to call with any hospice related questions or concerns.  Thank you,   Bobbie "Einar Gip, RN, BSN [facility] Liaison [phone]

## 2021-06-08 NOTE — Progress Notes (Signed)
Terrence Johnson  HRC:[id number] DOB: 03-18-1941 DOA: 05/23/2021 PCP: Patient, No Pcp Per (Inactive)    Brief Narrative:  80yo group home resident with a history of COPD, chronic cognitive impairment, DM2, HLD, psychotic disorder NOS, and HTN who presented to [facility] ED 05/23/2021 via EMS from the facility in which he lives with reports of AMS.  Per ED documentation the patient suffered a fall at his facility and at some point after his fall was noted to be exhibiting slurred speech as well as difficulty ambulating. EMS was summoned and discovered a heart rate in the 30s with blood pressure of 70/50.  He was dosed with atropine and IV fluid and transported to the ED.  Upon arrival in the ED the patient remained significantly altered.  He was noted to have ecchymosis over his right side as well as bilateral knees. In the ER he was found to be markedly hypothermic with a rectal temperature of 85.  He was also noted to be COVID-positive.  CT head and cervical spine at time of presentation was without acute findings.  Significant Events:  10/2 admit via [facility] ED hypothermic, hypotensive, bradycardic - Bair hugger + IVF  10/3 recurrent symptomatic bradycardia 10/4 remains altered  10/7 beginning to improve mental status  10/8 MRI brain unremarkable  10/11 transitioned to comfort care 10/13 awaiting hospice location determination 10/14 declined inpatient hospice; toc looking for snf bed  Date of Positive COVID Test:  05/23/21 (last day of isolation 06/02/21)   COVID-19 specific Treatment: Remdesivir 10/2 > 10/6 Hydrocortisone 10/2 > 10/6  Consultants:  [facility] Cardiology Psychiatry Neurology Gastroenterology  Code Status: NO CODE BLUE  Antimicrobials:  Zosyn 10/2 > 10/8 Vancomycin 10/2 Fosfomycin 10/9  DVT prophylaxis:  SCDs  Disposition plan: Patient requires SNF level care  Subjective: No acute events reported overnight.  Resting in bed comfortably, no complaints. Eating  lunch.   Assessment & Plan:  End-of-life care Transitioned to comfort care 10/11. Palliative consulted. Likely plan will be d/c to snf with hospice services - toc working on placement, no bed offers yet. Bed search extended   Symptomatic bradycardia -first-degree heart block with QT prolongation Likely the etiology of syncopal episodes leading to multiple recent falls -was on beta-blocker at time of admission which has been discontinued -episodic bradycardia persist but no hypotension associated with this and overall heart rate has normalized  Mildly elevated troponin -demand ischemia related to bradycardia Evaluated by cardiology with no indication for further work-up  Circulatory shock Resolved - likely due to a combination of acute infection as well as beta-blocker overdose  Multifactorial acute encephalopathy - baseline dementia  Hypothermia, bradycardia hypotension, infection all contributing -appears to have returned to his probable baseline -family reports gradual and steady cognitive decline in the past few months  Severe oral phase dysphagia likely multi-factorial -has been evaluated by by SLP - ong-term prognosis for "safe" resumption of p.o. intake felt to be quite poor and therefore short-term use of NG feedings not appropriate - not an appropriate candidate for [place]deva-term tube feedings, and family does not feel this would be right for him even if he was. Comfort feeds now.  Hypokalemia Due to no intake   COVID infection Sepsis Treated with Remdesivir and steroid -no evidence of persisting/active infection. Sepisis (hypothermia, hypotension) physiology resolved  Thrombocytopenia Likely related to COVID - platelet count improved prior to stopping lab draws  DM2 CBG well controlled prior to stopping checks  Enterococcus and Streptococcus cystitis Has completed an antibiotic course  Diverticulitis of cecum Has completed an antibiotic  course  Schizophrenia Psychiatry has evaluated. calm   Family Communication: updated brother larry on 10/17. Updated brother archie on 10/18.  Status is: Inpatient  Remains inpatient appropriate because:Inpatient level of care appropriate due to severity of illness  Dispo: The patient is from: SNF              Anticipated d/c is to: snf              Patient currently is medically stable to d/c.   Difficult to place patient No   Objective: Blood pressure (!) 166/73, pulse 94, temperature 98.7 F (37.1 C), temperature source Oral, resp. rate 18, height 5\' 7"  (1.702 m), weight 69.3 kg, SpO2 98 %.  Intake/Output Summary (Last 24 hours) at 06/08/2021 1342 Last data filed at 06/08/2021 1022 Gross per 24 hour  Intake 360 ml  Output --  Net 360 ml   Filed Weights   05/23/21 0747 05/23/21 1750  Weight: 68 kg 69.3 kg    Examination: General: No acute respiratory distress Lungs: Mild bibasilar crackles Cardiovascular: Regular rate and rhythm  Abdomen: Nontender, nondistended, soft, bowel sounds positive Extremities: No significant edema bilateral lower extremities  CBC: No results for input(s): WBC, NEUTROABS, HGB, HCT, MCV, PLT in the last 168 hours.  Basic Metabolic Panel: No results for input(s): NA, K, CL, CO2, GLUCOSE, BUN, CREATININE, CALCIUM, MG, PHOS in the last 168 hours.  GFR: Estimated Creatinine Clearance: 49.1 mL/min (by C-G formula based on SCr of 1.14 mg/dL).   No results for input(s): AMMONIA in the last 168 hours.    HbA1C: Hgb A1c MFr Bld  Date/Time Value Ref Range Status  05/23/2021 06:15 PM 7.9 (H) 4.8 - 5.6 % Final    Comment:    (NOTE)         Prediabetes: 5.7 - 6.4         Diabetes: >6.4         Glycemic control for adults with diabetes: <7.0   12/16/2020 06:22 AM 6.2 (H) 4.8 - 5.6 % Final    Comment:    (NOTE) Pre diabetes:          5.7%-6.4%  Diabetes:              >6.4%  Glycemic control for   <7.0% adults with diabetes      CBG: No results for input(s): GLUCAP in the last 168 hours.   No results found for this or any previous visit (from the past 240 hour(s)).       LOS: 16 days   12/18/2020, MD [facility] Pager - Text Page per Shonna Chock  If 7PM-7AM, please contact night-coverage per Amion 06/08/2021, 1:42 PM

## 2021-06-09 NOTE — Progress Notes (Addendum)
To whom it may concern:  Please be advised that the above named patient has a diagnosis of dementia that supercedes any psychiatric diagnosis.

## 2021-06-09 NOTE — Progress Notes (Signed)
Prognosis is less than six months survival based on progressive of baseline dementia resulting in physical debility, bradycardia with first-degree heart block, QT prolongation, high risk of recurrent aspiration due to severe oral dysphagia.

## 2021-06-09 NOTE — Progress Notes (Addendum)
PROGRESS NOTE    Terrence Johnson   PYK:[id number]  DOB: [birth year]  PCP: Patient, No Pcp Per (Inactive)    DOA: 05/23/2021 LOS: 17    Brief Narrative / Hospital Course to Date:   "80yo group home resident with a history of COPD, chronic cognitive impairment, DM2, HLD, psychotic disorder NOS, and HTN who presented to [facility] ED 05/23/2021 via EMS from the facility in which he lives with reports of AMS.  Per ED documentation the patient suffered a fall at his facility and at some point after his fall was noted to be exhibiting slurred speech as well as difficulty ambulating. EMS was summoned and discovered a heart rate in the 30s with blood pressure of 70/50.  He was dosed with atropine and IV fluid and transported to the ED.  Upon arrival in the ED the patient remained significantly altered.  He was noted to have ecchymosis over his right side as well as bilateral knees. In the ER he was found to be markedly hypothermic with a rectal temperature of 85.  He was also noted to be COVID-positive.  CT head and cervical spine at time of presentation was without acute findings.  Significant Events: 10/2 admit via [facility] ED hypothermic, hypotensive, bradycardic - Bair hugger + IVF  10/3 recurrent symptomatic bradycardia 10/4 remains altered  10/7 beginning to improve mental status  10/8 MRI brain unremarkable  10/11 transitioned to comfort care 10/13 awaiting hospice location determination 10/14 declined inpatient hospice; toc looking for snf bed"  Date of Positive COVID Test:  05/23/21 (last day of isolation 06/02/21)    COVID-19 specific Treatment: Remdesivir 10/2 > 10/6 Hydrocortisone 10/2 > 10/6  Assessment & Plan   Principal Problem:   Acute delirium Active Problems:   Acute cystitis   Schizophrenia (HCC)   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HCC)   Type 2 diabetes mellitus with hyperlipidemia (HCC)   CKD (chronic kidney disease), stage IIIa   HLD (hyperlipidemia)   Hypertension   COPD (chronic  obstructive pulmonary disease) (HCC)   Electrocardiography suggestive of ST elevation myocardial infarction (STEMI) (HCC)   Diverticulitis of cecum   COVID-19 virus infection   Catatonic posturing   End-of-life care Transitioned to comfort care 10/11.  Palliative Care consulted.  Plan discharge to SNF with hospice to follow. TOC working on placement.  Prognosis is less than six months survival based on progressive of baseline dementia resulting in physical debility, bradycardia with first-degree heart block, QT prolongation, high risk of recurrent aspiration due to severe oral dysphagia.    Symptomatic bradycardia -first-degree heart block with QT prolongation Likely the etiology of syncopal episodes leading to multiple recent falls. Previously on beta-blocker at time of admission - discontinued  Episodic bradycardia persist but no hypotension. Overall heart rate has normalized.   Mildly elevated troponin -demand ischemia related to bradycardia Evaluated by cardiology with no indication for further work-up   Circulatory shock Resolved - likely due to a combination of acute infection as well as beta-blocker overdose   Multifactorial acute encephalopathy - baseline dementia  Hypothermia, bradycardia hypotension, infection all contributing -appears to have returned to his probable baseline -family reports gradual and steady cognitive decline in the past few months   Severe oral phase dysphagia likely multi-factorial -has been evaluated by by SLP - ong-term prognosis for "safe" resumption of p.o. intake felt to be quite poor and therefore short-term use of NG feedings not appropriate - not an appropriate candidate for Nephew-term tube feedings, and family does not feel this  would be right for him even if he was. Comfort feeds now.   Hypokalemia Due to no intake    COVID infection complicated by Sepsis Treated with Remdesivir and steroid -no evidence of persisting/active infection.   Sepisis (hypothermia, hypotension) physiology resolved   Thrombocytopenia Likely related to COVID - platelet count improved prior to stopping lab draws   Type 2 diabetes CBG well controlled prior to stopping checks   Enterococcus and Streptococcus cystitis Has completed an antibiotic course   Diverticulitis of cecum Has completed an antibiotic course   Schizophrenia Psychiatry has evaluated. Calm behavior. No acute issues.    Patient BMI: Body mass index is 23.93 kg/m.   DVT prophylaxis:    Diet:  Diet Orders (From admission, onward)     Start     Ordered   06/02/21 1405  DIET DYS 3 Room service appropriate? Yes; Fluid consistency: Thin  Diet effective now       Comments: Pleasure foods as requested/tolerated  Question Answer Comment  Room service appropriate? Yes   Fluid consistency: Thin      06/02/21 1405              Code Status: DNR   Subjective 06/09/21    Patient was awake resting in bed when seen today.  He reports he feels well.  Wants to eat breakfast.  Denies fevers or chills, pain, nausea or vomiting.  States he is just waking up and still tired but otherwise denies any acute complaints.   Disposition Plan & Communication   Status is: Inpatient  Remains inpatient appropriate because: SNF placement pending     Consults, Procedures, Significant Events   Consultants:  PCCM Cardiology Psychiatry Neurology Gastroenterology   Antimicrobials:  Anti-infectives (From admission, onward)    Start     Dose/Rate Route Frequency Ordered Stop   05/30/21 2215  fosfomycin (MONUROL) packet 3 g        3 g Per Tube  Once 05/30/21 2117 05/30/21 2148   05/26/21 1345  acyclovir (ZOVIRAX) 695 mg in dextrose 5 % 100 mL IVPB  Status:  Discontinued        10 mg/kg  69.3 kg 113.9 mL/hr over 60 Minutes Intravenous Every 12 hours 05/26/21 1248 05/26/21 1254   05/24/21 1000  remdesivir 100 mg in sodium chloride 0.9 % 100 mL IVPB       See Hyperspace for  full Linked Orders Report.   100 mg 200 mL/hr over 30 Minutes Intravenous Daily 05/23/21 1432 05/27/21 1152   05/23/21 1500  remdesivir 200 mg in sodium chloride 0.9% 250 mL IVPB       See Hyperspace for full Linked Orders Report.   200 mg 580 mL/hr over 30 Minutes Intravenous Once 05/23/21 1432 05/23/21 1653   05/23/21 1430  piperacillin-tazobactam (ZOSYN) IVPB 3.375 g  Status:  Discontinued        3.375 g 12.5 mL/hr over 240 Minutes Intravenous Every 8 hours 05/23/21 1419 05/31/21 1312   05/23/21 0915  metroNIDAZOLE (FLAGYL) IVPB 500 mg        500 mg 100 mL/hr over 60 Minutes Intravenous  Once 05/23/21 0908 05/23/21 1222   05/23/21 0845  ceFEPIme (MAXIPIME) 2 g in sodium chloride 0.9 % 100 mL IVPB        2 g 200 mL/hr over 30 Minutes Intravenous  Once 05/23/21 0844 05/23/21 1223   05/23/21 0845  vancomycin (VANCOREADY) IVPB 1250 mg/250 mL        1,250 mg  166.7 mL/hr over 90 Minutes Intravenous  Once 05/23/21 0844 05/23/21 1402         Micro    Objective   Vitals:   06/08/21 0524 06/08/21 0831 06/09/21 0546 06/09/21 0700  BP: (!) 165/78 (!) 166/73 (!) 164/64 (!) 170/84  Pulse: 83 94 85 78  Resp: 18 18 16 18   Temp: 97.7 F (36.5 C) 98.7 F (37.1 C) 98.4 F (36.9 C) 97.6 F (36.4 C)  TempSrc: Oral Oral Oral Oral  SpO2: 99% 98% 100% 100%  Weight:      Height:       No intake or output data in the 24 hours ending 06/09/21 1423 Filed Weights   05/23/21 0747 05/23/21 1750  Weight: 68 kg 69.3 kg    Physical Exam:  General exam: awake, alert, no acute distress, appears comfortable HEENT: moist mucus membranes, hearing grossly normal  Respiratory system: CTAB, no wheezes, rales or rhonchi, normal respiratory effort. Cardiovascular system: normal S1/S2, RRR, no pedal edema.   Gastrointestinal system: soft, NT, ND, no HSM felt, +bowel sounds. Central nervous system: no gross focal neurologic deficits, normal speech Extremities: moves all, no edema, normal tone Skin:  dry, intact, normal temperature Psychiatry: normal mood, congruent affect  Labs   Data Reviewed: I have personally reviewed following labs and imaging studies  CBC: No results for input(s): WBC, NEUTROABS, HGB, HCT, MCV, PLT in the last 168 hours. Basic Metabolic Panel: No results for input(s): NA, K, CL, CO2, GLUCOSE, BUN, CREATININE, CALCIUM, MG, PHOS in the last 168 hours. GFR: Estimated Creatinine Clearance: 49.1 mL/min (by C-G formula based on SCr of 1.14 mg/dL). Liver Function Tests: No results for input(s): AST, ALT, ALKPHOS, BILITOT, PROT, ALBUMIN in the last 168 hours. No results for input(s): LIPASE, AMYLASE in the last 168 hours. No results for input(s): AMMONIA in the last 168 hours. Coagulation Profile: No results for input(s): INR, PROTIME in the last 168 hours. Cardiac Enzymes: No results for input(s): CKTOTAL, CKMB, CKMBINDEX, TROPONINI in the last 168 hours. BNP (last 3 results) No results for input(s): PROBNP in the last 8760 hours. HbA1C: No results for input(s): HGBA1C in the last 72 hours. CBG: No results for input(s): GLUCAP in the last 168 hours. Lipid Profile: No results for input(s): CHOL, HDL, LDLCALC, TRIG, CHOLHDL, LDLDIRECT in the last 72 hours. Thyroid Function Tests: No results for input(s): TSH, T4TOTAL, FREET4, T3FREE, THYROIDAB in the last 72 hours. Anemia Panel: No results for input(s): VITAMINB12, FOLATE, FERRITIN, TIBC, IRON, RETICCTPCT in the last 72 hours. Sepsis Labs: No results for input(s): PROCALCITON, LATICACIDVEN in the last 168 hours.  No results found for this or any previous visit (from the past 240 hour(s)).    Imaging Studies   No results found.   Medications   Scheduled Meds: Continuous Infusions:     LOS: 17 days    Time spent: 30 minutes    07/23/21, DO [facility]  06/09/2021, 2:23 PM      If 7PM-7AM, please contact night-coverage. How to contact the [facility] Attending or Consulting  provider 7A - 7P or covering provider during after hours 7P -7A, for this patient?    Check the care team in [facility] and look for a) attending/consulting TRH provider listed and b) the [facility] team listed Log into [email or web address] and use [place]'s universal password to access. If you do not have the password, please contact the hospital operator. Locate the [facility] provider you are looking for under [facility]  and page to a number that you can be directly reached. If you still have difficulty reaching the provider, please page the [facility] (Director on Call) for the Hospitalists listed on amion for assistance.

## 2021-06-09 NOTE — TOC Progression Note (Signed)
Transition of Care [facility]) - Progression Note    Patient Details  Name: Terrence Johnson MRN: [id number] Date of Birth: [birth year]  [facility]) CM/SW Contact  Allayne Butcher, RN Phone Number: 06/09/2021, 2:45 PM  Clinical Narrative:    Passr is pending and patient must have a passr before going to [place] rehab.     Expected Discharge Plan: Skilled Nursing Facility Barriers to Discharge: Continued Medical Work up  Expected Discharge Plan and Services Expected Discharge Plan: Skilled Nursing Facility In-house Referral: Clinical Social Work     Living arrangements for the past 2 months: Assisted Living Facility ([facility])                                       Social Determinants of Health (SDOH) Interventions    Readmission Risk Interventions No flowsheet data found.

## 2021-06-10 MED ORDER — SENNA 8.6 MG PO TABS
1.0000 | ORAL_TABLET | Freq: Every day | ORAL | Status: DC
Start: 2021-06-10 — End: 2021-06-11
  Administered 2021-06-10: 22:00:00 8.6 mg via ORAL
  Filled 2021-06-10: qty 1

## 2021-06-10 NOTE — TOC Progression Note (Signed)
Transition of Care [facility]) - Progression Note    Patient Details  Name: Terrence Johnson MRN: [id number] Date of Birth: June 21, 1941  Transition of Care [facility]) CM/SW Contact  Allayne Butcher, RN Phone Number: 06/10/2021, 12:10 PM  Clinical Narrative:    Pasrr went to level 2.  [facility] updated on status.    Expected Discharge Plan: Skilled Nursing Facility Barriers to Discharge: Continued Medical Work up  Expected Discharge Plan and Services Expected Discharge Plan: Skilled Nursing Facility In-house Referral: Clinical Social Work     Living arrangements for the past 2 months: Assisted Living Facility ([facility])                                       Social Determinants of Health (SDOH) Interventions    Readmission Risk Interventions No flowsheet data found.

## 2021-06-10 NOTE — Progress Notes (Addendum)
PROGRESS NOTE    Terrence Johnson   VQQ:[id number]  DOB: 02/19/1941  PCP: Patient, No Pcp Per (Inactive)    DOA: 05/23/2021 LOS: 18    Brief Narrative / Hospital Course to Date:   "80yo group home resident with a history of COPD, chronic cognitive impairment, DM2, HLD, psychotic disorder NOS, and HTN who presented to [facility] ED 05/23/2021 via EMS from the facility in which he lives with reports of AMS.  Per ED documentation the patient suffered a fall at his facility and at some point after his fall was noted to be exhibiting slurred speech as well as difficulty ambulating. EMS was summoned and discovered a heart rate in the 30s with blood pressure of 70/50.  He was dosed with atropine and IV fluid and transported to the ED.  Upon arrival in the ED the patient remained significantly altered.  He was noted to have ecchymosis over his right side as well as bilateral knees. In the ER he was found to be markedly hypothermic with a rectal temperature of 85.  He was also noted to be COVID-positive.  CT head and cervical spine at time of presentation was without acute findings.  Significant Events: 10/2 admit via [facility] ED hypothermic, hypotensive, bradycardic - Bair hugger + IVF  10/3 recurrent symptomatic bradycardia 10/4 remains altered  10/7 beginning to improve mental status  10/8 MRI brain unremarkable  10/11 transitioned to comfort care 10/13 awaiting hospice location determination 10/14 declined inpatient hospice; toc looking for snf bed"  Date of Positive COVID Test:  05/23/21 (last day of isolation 06/02/21)    COVID-19 specific Treatment: Remdesivir 10/2 > 10/6 Hydrocortisone 10/2 > 10/6  Assessment & Plan   Principal Problem:   Acute delirium Active Problems:   Acute cystitis   Schizophrenia (HCC)   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HCC)   Type 2 diabetes mellitus with hyperlipidemia (HCC)   CKD (chronic kidney disease), stage IIIa   HLD (hyperlipidemia)   Hypertension   COPD (chronic  obstructive pulmonary disease) (HCC)   Electrocardiography suggestive of ST elevation myocardial infarction (STEMI) (HCC)   Diverticulitis of cecum   COVID-19 virus infection   Catatonic posturing   End-of-life care Transitioned to comfort care 10/11.  Palliative Care consulted.  Plan discharge to SNF with hospice to follow. TOC working on placement.  Prognosis is less than six months survival based on progressive of baseline dementia resulting in physical debility, bradycardia with first-degree heart block, QT prolongation, high risk of recurrent aspiration due to severe oral dysphagia.     Symptomatic bradycardia -first-degree heart block with QT prolongation Likely the etiology of syncopal episodes leading to multiple recent falls. Previously on beta-blocker at time of admission - discontinued  Episodic bradycardia persist but no hypotension. Overall heart rate has normalized.   Mildly elevated troponin -demand ischemia related to bradycardia Evaluated by cardiology with no indication for further work-up   Circulatory shock Resolved - likely due to a combination of acute infection as well as beta-blocker overdose   Multifactorial acute encephalopathy - baseline dementia  Hypothermia, bradycardia hypotension, infection all contributing -appears to have returned to his probable baseline -family reports gradual and steady cognitive decline in the past few months   Severe oral phase dysphagia likely multi-factorial -has been evaluated by by SLP - ong-term prognosis for "safe" resumption of p.o. intake felt to be quite poor and therefore short-term use of NG feedings not appropriate - not an appropriate candidate for Izquierdo-term tube feedings, and family does not feel  this would be right for him even if he was. Comfort feeds now.   Hypokalemia Due to no intake    COVID infection complicated by Sepsis Treated with Remdesivir and steroid -no evidence of persisting/active infection.   Sepisis (hypothermia, hypotension) physiology resolved   Thrombocytopenia Likely related to COVID - platelet count improved prior to stopping lab draws   Type 2 diabetes CBG well controlled prior to stopping checks   Enterococcus and Streptococcus cystitis Has completed an antibiotic course   Diverticulitis of cecum Has completed an antibiotic course   Schizophrenia Psychiatry has evaluated. Calm behavior. No acute issues.   Constipation -scheduled senna at bedtime with hold parameter if loose or frequent BMs   Patient BMI: Body mass index is 23.93 kg/m.   DVT prophylaxis:    Diet:  Diet Orders (From admission, onward)     Start     Ordered   06/02/21 1405  DIET DYS 3 Room service appropriate? Yes; Fluid consistency: Thin  Diet effective now       Comments: Pleasure foods as requested/tolerated  Question Answer Comment  Room service appropriate? Yes   Fluid consistency: Thin      06/02/21 1405              Code Status: DNR   Subjective 06/10/21    Patient sleeping but woke easily to voice when seen on rounds this morning.  He states that he wants to eat, breakfast tray is at bedside he is waiting for assistance to eat.  No other acute complaints or acute events reported.   Disposition Plan & Communication   Status is: Inpatient  Remains inpatient appropriate because: SNF placement pending     Consults, Procedures, Significant Events   Consultants:  PCCM Cardiology Psychiatry Neurology Gastroenterology   Antimicrobials:  Anti-infectives (From admission, onward)    Start     Dose/Rate Route Frequency Ordered Stop   05/30/21 2215  fosfomycin (MONUROL) packet 3 g        3 g Per Tube  Once 05/30/21 2117 05/30/21 2148   05/26/21 1345  acyclovir (ZOVIRAX) 695 mg in dextrose 5 % 100 mL IVPB  Status:  Discontinued        10 mg/kg  69.3 kg 113.9 mL/hr over 60 Minutes Intravenous Every 12 hours 05/26/21 1248 05/26/21 1254   05/24/21 1000   remdesivir 100 mg in sodium chloride 0.9 % 100 mL IVPB       See Hyperspace for full Linked Orders Report.   100 mg 200 mL/hr over 30 Minutes Intravenous Daily 05/23/21 1432 05/27/21 1152   05/23/21 1500  remdesivir 200 mg in sodium chloride 0.9% 250 mL IVPB       See Hyperspace for full Linked Orders Report.   200 mg 580 mL/hr over 30 Minutes Intravenous Once 05/23/21 1432 05/23/21 1653   05/23/21 1430  piperacillin-tazobactam (ZOSYN) IVPB 3.375 g  Status:  Discontinued        3.375 g 12.5 mL/hr over 240 Minutes Intravenous Every 8 hours 05/23/21 1419 05/31/21 1312   05/23/21 0915  metroNIDAZOLE (FLAGYL) IVPB 500 mg        500 mg 100 mL/hr over 60 Minutes Intravenous  Once 05/23/21 0908 05/23/21 1222   05/23/21 0845  ceFEPIme (MAXIPIME) 2 g in sodium chloride 0.9 % 100 mL IVPB        2 g 200 mL/hr over 30 Minutes Intravenous  Once 05/23/21 0844 05/23/21 1223   05/23/21 0845  vancomycin (VANCOREADY) IVPB  1250 mg/250 mL        1,250 mg 166.7 mL/hr over 90 Minutes Intravenous  Once 05/23/21 0844 05/23/21 1402         Micro    Objective   Vitals:   06/09/21 0700 06/09/21 1545 06/10/21 0452 06/10/21 0755  BP: (!) 170/84 (!) 154/74 138/72 134/84  Pulse: 78 85 80 85  Resp: 18 16 15 18   Temp: 97.6 F (36.4 C) 98 F (36.7 C) 98.6 F (37 C) 98.3 F (36.8 C)  TempSrc: Oral Oral Oral   SpO2: 100% 100% 100% 100%  Weight:      Height:        Intake/Output Summary (Last 24 hours) at 06/10/2021 1435 Last data filed at 06/09/2021 2000 Gross per 24 hour  Intake 118 ml  Output --  Net 118 ml   Filed Weights   05/23/21 0747 05/23/21 1750  Weight: 68 kg 69.3 kg    Physical Exam:  General exam: awake, alert, no acute distress, appears comfortable Respiratory system: normal respiratory effort, on room air. Cardiovascular system: normal S1/S2, RRR, no pedal edema.   Gastrointestinal system: soft, non-tender abdomen Central nervous system: no gross focal neurologic deficits,  normal speech Psychiatry: normal mood, congruent affect  Labs   Data Reviewed: I have personally reviewed following labs and imaging studies  CBC: No results for input(s): WBC, NEUTROABS, HGB, HCT, MCV, PLT in the last 168 hours. Basic Metabolic Panel: No results for input(s): NA, K, CL, CO2, GLUCOSE, BUN, CREATININE, CALCIUM, MG, PHOS in the last 168 hours. GFR: Estimated Creatinine Clearance: 49.1 mL/min (by C-G formula based on SCr of 1.14 mg/dL). Liver Function Tests: No results for input(s): AST, ALT, ALKPHOS, BILITOT, PROT, ALBUMIN in the last 168 hours. No results for input(s): LIPASE, AMYLASE in the last 168 hours. No results for input(s): AMMONIA in the last 168 hours. Coagulation Profile: No results for input(s): INR, PROTIME in the last 168 hours. Cardiac Enzymes: No results for input(s): CKTOTAL, CKMB, CKMBINDEX, TROPONINI in the last 168 hours. BNP (last 3 results) No results for input(s): PROBNP in the last 8760 hours. HbA1C: No results for input(s): HGBA1C in the last 72 hours. CBG: No results for input(s): GLUCAP in the last 168 hours. Lipid Profile: No results for input(s): CHOL, HDL, LDLCALC, TRIG, CHOLHDL, LDLDIRECT in the last 72 hours. Thyroid Function Tests: No results for input(s): TSH, T4TOTAL, FREET4, T3FREE, THYROIDAB in the last 72 hours. Anemia Panel: No results for input(s): VITAMINB12, FOLATE, FERRITIN, TIBC, IRON, RETICCTPCT in the last 72 hours. Sepsis Labs: No results for input(s): PROCALCITON, LATICACIDVEN in the last 168 hours.  No results found for this or any previous visit (from the past 240 hour(s)).    Imaging Studies   No results found.   Medications   Scheduled Meds:  senna  1 tablet Oral QHS   Continuous Infusions:     LOS: 18 days    Time spent: 25 minutes with > 50% spent at bedside and in coordination of care     07/23/21, DO [facility]  06/10/2021, 2:35 PM      If 7PM-7AM, please contact  night-coverage. How to contact the [facility] Attending or Consulting provider 7A - 7P or covering provider during after hours 7P -7A, for this patient?    Check the care team in [facility] and look for a) attending/consulting TRH provider listed and b) the [facility] team listed Log into [email or web address] and use [place]'s universal password to access.  If you do not have the password, please contact the hospital operator. Locate the [facility] provider you are looking for under [facility] and page to a number that you can be directly reached. If you still have difficulty reaching the provider, please page the [facility] (Director on Call) for the Hospitalists listed on amion for assistance.

## 2021-06-10 NOTE — TOC Progression Note (Signed)
Transition of Care [facility]) - Progression Note    Patient Details  Name: Terrence Johnson MRN: [id number] Date of Birth: [birth year]  Transition of Care [facility]) CM/SW Contact  Allayne Butcher, RN Phone Number: 06/10/2021, 3:46 PM  Clinical Narrative:    Pasrr is back H[place].  [phone] H.  Plan for discharge tomorrow to [facility].     Expected Discharge Plan: Skilled Nursing Facility Barriers to Discharge: Continued Medical Work up  Expected Discharge Plan and Services Expected Discharge Plan: Skilled Nursing Facility In-house Referral: Clinical Social Work     Living arrangements for the past 2 months: Assisted Living Facility ([facility])                                       Social Determinants of Health (SDOH) Interventions    Readmission Risk Interventions No flowsheet data found.

## 2021-06-10 NOTE — Progress Notes (Signed)
[facility] [employer] [facility]) Hospital Liaison Note  Notified by Robbie Lis, RN [place] manager of patient/family request for [facility] Palliative services at skilled nursing facility after discharge.  [facility] hospital liaison will follow patient for discharge disposition.  Please call with any hospice or outpatient palliative care related questions.  Thank you for the opportunity to participate in this patient's care.  Bobbie "Einar Gip, RN, BSN [facility] Liasion [phone]

## 2021-06-11 DIAGNOSIS — Z515 Encounter for palliative care: Secondary | ICD-10-CM

## 2021-06-11 MED ORDER — SENNA 8.6 MG PO TABS: 1.0000 | ORAL_TABLET | Freq: Every day | ORAL | 0 refills | Status: AC

## 2021-06-11 MED ORDER — MORPHINE SULFATE (CONCENTRATE) 10 MG/0.5ML PO SOLN: 5.0000 mg | ORAL | 0 refills | Status: AC | PRN

## 2021-06-11 MED ORDER — ACETAMINOPHEN 325 MG PO TABS: 650.0000 mg | ORAL_TABLET | Freq: Four times a day (QID) | ORAL | Status: AC | PRN

## 2021-06-11 MED ORDER — LORAZEPAM 1 MG PO TABS: 1.0000 mg | ORAL_TABLET | ORAL | 0 refills | Status: AC | PRN

## 2021-06-11 MED ORDER — HALOPERIDOL LACTATE 2 MG/ML PO CONC: 0.5000 mg | ORAL | 0 refills | Status: AC | PRN

## 2021-06-11 MED ORDER — ONDANSETRON 4 MG PO TBDP: 4.0000 mg | ORAL_TABLET | Freq: Four times a day (QID) | ORAL | 0 refills | Status: AC | PRN

## 2021-06-11 MED ORDER — ACETAMINOPHEN 650 MG RE SUPP: 650.0000 mg | Freq: Four times a day (QID) | RECTAL | 0 refills | Status: AC | PRN

## 2021-06-11 MED ORDER — BIOTENE DRY MOUTH MT LIQD: 15.0000 mL | OROMUCOSAL | Status: AC | PRN

## 2021-06-11 MED ORDER — LORAZEPAM 2 MG/ML PO CONC: 1.0000 mg | ORAL | 0 refills | Status: AC | PRN

## 2021-06-11 MED ORDER — HALOPERIDOL 0.5 MG PO TABS: 0.5000 mg | ORAL_TABLET | ORAL | Status: AC | PRN

## 2021-06-11 NOTE — Care Management Important Message (Signed)
Important Message  Patient Details  Name: Terrence Johnson MRN: [id number] Date of Birth: 1941/05/17   Medicare Important Message Given:  Other (see comment)  Late entry:  Tried to contact the brother, Parker Wherley [phone]) multiple times today but did not receive a call back and patient has now discharged.    Olegario Messier A Carime Dinkel 06/11/2021, 3:29 PM

## 2021-06-11 NOTE — Care Management Important Message (Signed)
Important Message  Patient Details  Name: Terrence Johnson MRN: [id number] Date of Birth: December 07, 1940   Medicare Important Message Given:  Other (see comment)  RN CM has been discussing DC plan with brother, Archie Melander. I tried calling him this morning [phone]) to review the Important Message from Medicare.  There was no answer and will try again.    Olegario Messier A Dulse Rutan 06/11/2021, 9:42 AM

## 2021-06-11 NOTE — TOC Transition Note (Signed)
Transition of Care [facility]) - CM/SW Discharge Note   Patient Details  Name: Terrence Johnson MRN: [id number] Date of Birth: 09-24-1940  [facility] CM/SW Contact:  Allayne Butcher, RN Phone Number: 06/11/2021, 10:26 AM   Clinical Narrative:    Patient will discharge to [facility] today.  RNCM has tried calling brother, Archie 4 times this morning with no answer.  Reached out to brother, Peyton Najjar, and was able to speak with his wife to notify them of discharge from the hospital and admission to [facility].  Larry's wife reports that she will pass this on to Jenera and try to reach out to Archie, who she reports can be difficult to get a hold of.  RNCM will arrange EMS once Lewayne Bunting provides a room number.     Final next level of care: Skilled Nursing Facility Barriers to Discharge: Barriers Resolved   Patient Goals and CMS Choice Patient states their goals for this hospitalization and ongoing recovery are:: Family agrees with SNF for rehab with palliative and then transition to LTC with hospice CMS Medicare.gov Compare Post Acute Care list provided to:: Patient Represenative (must comment) Choice offered to / list presented to : Sibling  Discharge Placement PASRR number recieved: 06/10/21            Patient chooses bed at: [facility] Patient to be transferred to facility by: [facility] Name of family member notified: Jaycen Vercher- message given to his wife Patient and family notified of of transfer: 06/11/21  Discharge Plan and Services In-house Referral: Clinical Social Work              DME Arranged: N/A DME Agency: NA       HH Arranged: NA          Social Determinants of Health (SDOH) Interventions     Readmission Risk Interventions No flowsheet data found.

## 2021-06-11 NOTE — Care Management Important Message (Signed)
Important Message  Patient Details  Name: Ramaj Frangos MRN: [id number] Date of Birth: [birth year]   Medicare Important Message Given:  Other (see comment)  I tried calling the patients brother, Corian Handley [phone]) 2 more times but there is no answer and no voicemail set up.  Will try again.   Olegario Messier A Elsi Stelzer 06/11/2021, 1:23 PM

## 2021-06-11 NOTE — Discharge Summary (Signed)
Physician Discharge Summary  Terrence Johnson NGE:[id number] DOB: July 16, 1941 DOA: 05/23/2021  PCP: Patient, No Pcp Per (Inactive)  Admit date: 05/23/2021 Discharge date: 06/11/2021  Admitted From: home Disposition:  SNF  Recommendations for Outpatient Follow-up:  Follow up with hospice at SNF  Home Health: no  Equipment/Devices: none   Discharge Condition: stable  CODE STATUS: DNR  Diet recommendation:    Discharge Diagnoses: Principal Problem:   Acute delirium Active Problems:   Acute cystitis   Schizophrenia (HCC)   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HCC)   Type 2 diabetes mellitus with hyperlipidemia (HCC)   CKD (chronic kidney disease), stage IIIa   HLD (hyperlipidemia)   Hypertension   COPD (chronic obstructive pulmonary disease) (HCC)   Electrocardiography suggestive of ST elevation myocardial infarction (STEMI) (HCC)   Diverticulitis of cecum   COVID-19 virus infection   Catatonic posturing   Hospice care patient    Summary of HPI and Hospital Course:  "80yo group home resident with a history of COPD, chronic cognitive impairment, DM2, HLD, psychotic disorder NOS, and HTN who presented to [facility] ED 05/23/2021 via EMS from the facility in which he lives with reports of AMS.  Per ED documentation the patient suffered a fall at his facility and at some point after his fall was noted to be exhibiting slurred speech as well as difficulty ambulating. EMS was summoned and discovered a heart rate in the 30s with blood pressure of 70/50.  He was dosed with atropine and IV fluid and transported to the ED.  Upon arrival in the ED the patient remained significantly altered.  He was noted to have ecchymosis over his right side as well as bilateral knees. In the ER he was found to be markedly hypothermic with a rectal temperature of 85.  He was also noted to be COVID-positive.  CT head and cervical spine at time of presentation was without acute findings.   Significant Events: 10/2 admit via [facility] ED  hypothermic, hypotensive, bradycardic - Bair hugger + IVF  10/3 recurrent symptomatic bradycardia 10/4 remains altered  10/7 beginning to improve mental status  10/8 MRI brain unremarkable  10/11 transitioned to comfort care 10/13 awaiting hospice location determination 10/14 declined inpatient hospice; toc looking for snf bed"   Date of Positive COVID Test:  05/23/21 (last day of isolation 06/02/21)    COVID-19 specific Treatment: Remdesivir 10/2 > 10/6 Hydrocortisone 10/2 > 10/6      End-of-life care Transitioned to comfort care 10/11.  Palliative Care consulted.  Discharge to SNF with hospice to follow.   Prognosis is less than six months survival based on progressive of baseline dementia resulting in physical debility, bradycardia with first-degree heart block, QT prolongation, high risk of recurrent aspiration due to severe oral dysphagia.       Symptomatic bradycardia -first-degree heart block with QT prolongation Likely the etiology of syncopal episodes leading to multiple recent falls. Previously on beta-blocker at time of admission - discontinued  Episodic bradycardia persist but no hypotension. Overall heart rate has normalized.   Mildly elevated troponin -demand ischemia related to bradycardia Evaluated by cardiology with no indication for further work-up   Circulatory shock - Resolved - likely due to a combination of acute infection as well as beta-blocker overdose   Multifactorial acute encephalopathy - baseline dementia  Hypothermia, bradycardia hypotension, infection all contributing -appears to have returned to his probable baseline -family reports gradual and steady cognitive decline in the past few months   Severe oral phase dysphagia - Comfort feeds  likely multi-factorial - evaluated by by SLP - Matson-term prognosis for "safe" resumption of p.o. intake felt to be quite poor and therefore short-term use of NG feedings not appropriate - not an appropriate  candidate for Alyea-term tube feedings, and family does not feel this would be right for him even if he was.    Hypokalemia - Due to no intake.  No further labs given goals of care, comfort.    COVID infection complicated by Sepsis Treated with Remdesivir and steroid -no evidence of persisting/active infection.  Sepisis (hypothermia, hypotension) physiology resolved   Thrombocytopenia - Likely related to COVID - platelet count improved prior to stopping lab draws   Type 2 diabetes - CBG well controlled prior to stopping checks   Enterococcus and Streptococcus cystitis - Has completed an antibiotic course.   Diverticulitis of cecum - Has completed an antibiotic course.   Schizophrenia Psychiatry has evaluated. Calm behavior. No acute issues.    Constipation -scheduled senna at bedtime with hold parameter if loose or frequent BMs     Discharge Instructions   Discharge Instructions     Call MD for:   Complete by: As directed    Pain, distress or discomfort   Call MD for:  persistant nausea and vomiting   Complete by: As directed    Call MD for:  severe uncontrolled pain   Complete by: As directed    Diet - low sodium heart healthy   Complete by: As directed    Increase activity slowly   Complete by: As directed       Allergies as of 06/11/2021       Reactions   Aricept [donepezil] Other (See Comments)   Severe bradycardia        Medication List     STOP taking these medications    amLODipine 5 MG tablet Commonly known as: NORVASC   aspirin EC 81 MG tablet   atorvastatin 40 MG tablet Commonly known as: LIPITOR   azithromycin 250 MG tablet Commonly known as: ZITHROMAX   benztropine 0.5 MG tablet Commonly known as: COGENTIN   carvedilol 12.5 MG tablet Commonly known as: COREG   clopidogrel 75 MG tablet Commonly known as: PLAVIX   Ipratropium-Albuterol 20-100 MCG/ACT Aers respimat Commonly known as: COMBIVENT   Lidocaine 4 % Ptch Commonly known  as: HM Lidocaine Patch   nicotine 21 mg/24hr patch Commonly known as: NICODERM CQ - dosed in mg/24 hours   paliperidone 9 MG 24 hr tablet Commonly known as: INVEGA   polyethylene glycol 17 g packet Commonly known as: MIRALAX / GLYCOLAX   Probiotic Acidophilus Caps   traZODone 50 MG tablet Commonly known as: DESYREL   trihexyphenidyl 5 MG tablet Commonly known as: ARTANE   Vitamin D-3 125 MCG (5000 UT) Tabs       TAKE these medications    acetaminophen 325 MG tablet Commonly known as: TYLENOL Take 2 tablets (650 mg total) by mouth every 6 (six) hours as needed for mild pain (or Fever >/= 101).   acetaminophen 650 MG suppository Commonly known as: TYLENOL Place 1 suppository (650 mg total) rectally every 6 (six) hours as needed for mild pain (or Fever >/= 101).   antiseptic oral rinse Liqd Apply 15 mLs topically as needed for dry mouth.   haloperidol 0.5 MG tablet Commonly known as: HALDOL Take 1 tablet (0.5 mg total) by mouth every 4 (four) hours as needed for agitation (or delirium).   haloperidol 2 MG/ML solution Commonly known  as: HALDOL Place 0.3 mLs (0.6 mg total) under the tongue every 4 (four) hours as needed for agitation (or delirium).   LORazepam 1 MG tablet Commonly known as: ATIVAN Take 1 tablet (1 mg total) by mouth every 4 (four) hours as needed for anxiety.   LORazepam 2 MG/ML concentrated solution Commonly known as: ATIVAN Place 0.5 mLs (1 mg total) under the tongue every 4 (four) hours as needed for anxiety.   morphine CONCENTRATE 10 MG/0.5ML Soln concentrated solution Take 0.25 mLs (5 mg total) by mouth every 2 (two) hours as needed for moderate pain (or dyspnea).   ondansetron 4 MG disintegrating tablet Commonly known as: ZOFRAN-ODT Take 1 tablet (4 mg total) by mouth every 6 (six) hours as needed for nausea.   senna 8.6 MG Tabs tablet Commonly known as: SENOKOT Take 1 tablet (8.6 mg total) by mouth at bedtime. Hold if loose or frequent  stools.        Contact information for after-discharge care     Destination     [facility] .   Service: Skilled Nursing Contact information: [address] [place] [address] [phone]                    Allergies  Allergen Reactions   Aricept [Donepezil] Other (See Comments)    Severe bradycardia     If you experience worsening of your admission symptoms, develop shortness of breath, life threatening emergency, suicidal or homicidal thoughts you must seek medical attention immediately by calling 911 or calling your MD immediately  if symptoms less severe.    Please note   You were cared for by a hospitalist during your hospital stay. If you have any questions about your discharge medications or the care you received while you were in the hospital after you are discharged, you can call the unit and asked to speak with the hospitalist on call if the hospitalist that took care of you is not available. Once you are discharged, your primary care physician will handle any further medical issues. Please note that NO REFILLS for any discharge medications will be authorized once you are discharged, as it is imperative that you return to your primary care physician (or establish a relationship with a primary care physician if you do not have one) for your aftercare needs so that they can reassess your need for medications and monitor your lab values.   Consultations: PCCM Cardiology Psychiatry Neurology Gastroenterology    Procedures/Studies: CT HEAD WO CONTRAST ( )  Result Date: 05/23/2021 CLINICAL DATA:  Mental status change with unknown cause. Abdominal trauma EXAM: CT HEAD WITHOUT CONTRAST CT CERVICAL SPINE WITHOUT CONTRAST TECHNIQUE: Multidetector CT imaging of the head and cervical spine was performed following the standard protocol without intravenous contrast. Multiplanar CT image reconstructions of the  cervical spine were also generated. COMPARISON:  None. FINDINGS: CT HEAD FINDINGS Brain: No evidence of acute infarction, hemorrhage, hydrocephalus, extra-axial collection or mass lesion/mass effect. Confluent chronic small vessel ischemia in the frontal white matter. Small chronic infarcts in the cerebellum. Vascular: No hyperdense vessel or unexpected calcification. Skull: Normal. Negative for fracture or focal lesion. Sinuses/Orbits: No acute finding CT CERVICAL SPINE FINDINGS Alignment: No traumatic malalignment.  Mild C3-4 anterolisthesis. Skull base and vertebrae: No acute fracture or incidental destructive process. Soft tissues and spinal canal: No prevertebral fluid or swelling. No visible canal hematoma. Disc levels: Generalized disc degeneration with disc osteophyte complexes narrowing the canal especially at C4-5.  multilevel facet spurring. Upper chest: Mild emphysema at the apices. IMPRESSION: No evidence of acute intracranial or cervical spine injury. Electronically Signed   By: Tiburcio Pea M.D.   On: 05/23/2021 08:38   CT Cervical Spine Wo Contrast  Result Date: 05/23/2021 CLINICAL DATA:  Mental status change with unknown cause. Abdominal trauma EXAM: CT HEAD WITHOUT CONTRAST CT CERVICAL SPINE WITHOUT CONTRAST TECHNIQUE: Multidetector CT imaging of the head and cervical spine was performed following the standard protocol without intravenous contrast. Multiplanar CT image reconstructions of the cervical spine were also generated. COMPARISON:  None. FINDINGS: CT HEAD FINDINGS Brain: No evidence of acute infarction, hemorrhage, hydrocephalus, extra-axial collection or mass lesion/mass effect. Confluent chronic small vessel ischemia in the frontal white matter. Small chronic infarcts in the cerebellum. Vascular: No hyperdense vessel or unexpected calcification. Skull: Normal. Negative for fracture or focal lesion. Sinuses/Orbits: No acute finding CT CERVICAL SPINE FINDINGS Alignment: No traumatic  malalignment.  Mild C3-4 anterolisthesis. Skull base and vertebrae: No acute fracture or incidental destructive process. Soft tissues and spinal canal: No prevertebral fluid or swelling. No visible canal hematoma. Disc levels: Generalized disc degeneration with disc osteophyte complexes narrowing the canal especially at C4-5. multilevel facet spurring. Upper chest: Mild emphysema at the apices. IMPRESSION: No evidence of acute intracranial or cervical spine injury. Electronically Signed   By: Tiburcio Pea M.D.   On: 05/23/2021 08:38   MR BRAIN W WO CONTRAST  Result Date: 05/28/2021 CLINICAL DATA:  Delirium. EXAM: MRI HEAD WITHOUT AND WITH CONTRAST TECHNIQUE: Multiplanar, multiecho pulse sequences of the brain and surrounding structures were obtained without and with intravenous contrast. CONTRAST:  7mL GADAVIST GADOBUTROL 1 MMOL/ML IV SOLN COMPARISON:  Head CT May 23, 2021 FINDINGS: The study is significantly degraded by motion. Brain: No acute infarction, hemorrhage, hydrocephalus, extra-axial collection or mass lesion. Remote lacunar infarct in the right cerebellar hemisphere, pons, left thalamus, genu and body of the corpus callosum. Scattered and confluent foci of T2 hyperintensity are seen within the white matter of the cerebral hemispheres, nonspecific. No focus of abnormal contrast enhancement identified. Vascular: Normal flow voids. Skull and upper cervical spine: Normal marrow signal. Sinuses/Orbits: Mild scattered mucosal thickening throughout the paranasal sinuses. The orbits are maintained. Other: None. IMPRESSION: 1. Motion degraded study. 2. No acute intracranial abnormality. 3. 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 4. Advanced chronic microvascular ischemic changes the white matter. Electronically Signed   By: Baldemar Lenis M.D.   On: 05/28/2021 15:28   CT ABDOMEN PELVIS W CONTRAST  Result Date: 05/23/2021 CLINICAL DATA:   Trauma. EXAM: CT ABDOMEN AND PELVIS WITH CONTRAST TECHNIQUE: Multidetector CT imaging of the abdomen and pelvis was performed using the standard protocol following bolus administration of intravenous contrast. CONTRAST:  80mL OMNIPAQUE IOHEXOL 350 MG/ML SOLN COMPARISON:  None. FINDINGS: Lower chest: Bilateral gynecomastia. Possible mild thickening of the distal esophagus. Three-vessel coronary artery disease. No other abnormalities in the lung bases. Hepatobiliary: No discrete masses in the liver. Portal vein is normal. Suspected mild gallbladder wall thickening versus mild pericholecystic fluid. The gallbladder is otherwise normal. Pancreas: Unremarkable. No pancreatic ductal dilatation or surrounding inflammatory changes. Spleen: Normal in size without focal abnormality. Adrenals/Urinary Tract: There is a mass associated with the left adrenal gland measuring 2 cm with an attenuation 90 Hounsfield units. The left adrenal gland is normal. Bilateral renal cysts are identified. No suspicious masses. No hydronephrosis or perinephric stranding. No ureterectasis or ureteral stone identified. The bladder  is poorly distended. However, there does appear to be some bladder wall thickening. Stomach/Bowel: There is gastric wall thickening in the region of the gastric cardia/GE junction. There is wall thickening associated with the distal gastric body and gastric antrum. The small bowel is normal in appearance. There is wall thickening associated with the cecum. A diverticulum arises from the distal cecum with adjacent fat stranding. The appendix is normal. Vascular/Lymphatic: Calcified atherosclerosis is seen in the tortuous nonaneurysmal aorta, iliac vessels, and femoral vessels. No adenopathy. Reproductive: Prostate is unremarkable. Other: No free air. There is a small amount of fluid and stranding adjacent to the cecum. No other free fluid. Musculoskeletal: No acute or significant osseous findings. IMPRESSION: 1. Wall  thickening associated with the cecum with a diverticulum off the distal tip of the cecum and fat stranding adjacent to the diverticulum is likely due to cecal diverticulitis. The wall thickening is likely a secondary finding. Inflammatory bowel disease or underlying neoplasm is considered less likely. Recommend colonoscopy as an outpatient to further assess this region if the patient has not had a recent colonoscopy. 2. Gastric wall thickening near the GE junction/cardia and in the antrum, likely due to gastritis. Recommend direct visualization with endoscopy as an outpatient. 3. Possible mild gallbladder wall thickening or minimal pericholecystic fluid. An ultrasound of the right upper quadrant could better assess the gallbladder. 4. Possible bladder wall thickening. This could be due to cystitis. Recommend correlation with urinalysis. 5. 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 Recommend an adrenal CT as an outpatient. 6. Calcified atherosclerosis in the abdominal aorta. Electronically Signed   By: Gerome Sam III M.D.   On: 05/23/2021 08:53   DG Chest Port 1 View  Result Date: 05/24/2021 CLINICAL DATA:  Shortness of breath EXAM: PORTABLE CHEST 1 VIEW COMPARISON:  05/23/2021 FINDINGS: Cardiac shadow is mildly prominent but stable. The lungs are well aerated bilaterally. No focal infiltrate or sizable effusion is seen. No bony abnormality is noted. IMPRESSION: No acute abnormality noted. Electronically Signed   By: Alcide Clever M.D.   On: 05/24/2021 02:50   DG Chest Portable 1 View  Result Date: 05/23/2021 CLINICAL DATA:  Altered mental status.  Rule out pneumonia EXAM: PORTABLE CHEST 1 VIEW COMPARISON:  01/26/2021 FINDINGS: Cardiomegaly. There is no edema, consolidation, effusion, or pneumothorax. Stable aortic and hilar contours. No acute osseous finding IMPRESSION: 1. No acute finding. 2. Chronic cardiomegaly. Electronically Signed   By: Tiburcio Pea M.D.   On: 05/23/2021 08:09   DG Abd Portable  1V  Result Date: 05/29/2021 CLINICAL DATA:  Enteric catheter placement EXAM: PORTABLE ABDOMEN - 1 VIEW COMPARISON:  05/23/2021 FINDINGS: Frontal view of the lower chest and upper abdomen demonstrates enteric catheter tip projecting over the gastric fundus. Bowel gas pattern is unremarkable. Bibasilar consolidation and effusions are seen at the lung bases. IMPRESSION: 1. Enteric catheter tip projecting over gastric fundus. Electronically Signed   By: Sharlet Salina M.D.   On: 05/29/2021 21:59   [place] EKG SITE RITE  Result Date: 05/29/2021 If Site Rite image not attached, placement could not be confirmed due to current cardiac rhythm.  US Abdomen Limited RUQ (LIVER/GB)  Result Date: 05/23/2021 CLINICAL DATA:  Possible wall thickening versus pericholecystic fluid seen on CT scan from today. EXAM: ULTRASOUND ABDOMEN LIMITED RIGHT UPPER QUADRANT COMPARISON:  None. FINDINGS: Gallbladder: Gallbladder wall is mildly thickened measuring 3.1 mm. 3 mm is the upper limits of normal. No pericholecystic fluid, Murphy's sign, stones, or sludge. Common bile  duct: Diameter: 3 mm Liver: No focal lesion identified. Within normal limits in parenchymal echogenicity. Portal vein is patent on color Doppler imaging with normal direction of blood flow towards the liver. Other: None. IMPRESSION: 1. The gallbladder wall is borderline to mildly thickened. The gallbladder is otherwise normal in appearance. No other significant abnormalities. Electronically Signed   By: Gerome Sam III M.D.   On: 05/23/2021 09:56       Subjective: Pt reports he feels well.  Wants to eat.  No acute events reported.   Discharge Exam: Vitals:   06/10/21 0755 06/11/21 0509  BP: 134/84 (!) 139/97  Pulse: 85 78  Resp: 18 18  Temp: 98.3 F (36.8 C) 97.7 F (36.5 C)  SpO2: 100% 100%   Vitals:   06/09/21 1545 06/10/21 0452 06/10/21 0755 06/11/21 0509  BP: (!) 154/74 138/72 134/84 (!) 139/97  Pulse: 85 80 85 78  Resp: Temp:  98 F (36.7 C) 98.6 F (37 C) 98.3 F (36.8 C) 97.7 F (36.5 C)  TempSrc: Oral Oral  Oral  SpO2: 100% 100% 100% 100%  Weight:      Height:        General: Pt is alert, awake, not in acute distress Cardiovascular: RRR, S1/S2 +, no rubs, no gallops Respiratory: CTA bilaterally, no wheezing, no rhonchi Abdominal: Soft, NT, ND, bowel sounds + Extremities: no edema, no cyanosis    The results of significant diagnostics from this hospitalization (including imaging, microbiology, ancillary and laboratory) are listed below for reference.     Microbiology: No results found for this or any previous visit (from the past 240 hour(s)).   Labs: BNP (last 3 results) Recent Labs    12/15/20 0759 01/26/21 0723 05/23/21 0750  BNP 80.1 29.1 75.9   Basic Metabolic Panel: No results for input(s): NA, K, CL, CO2, GLUCOSE, BUN, CREATININE, CALCIUM, MG, PHOS in the last 168 hours. Liver Function Tests: No results for input(s): AST, ALT, ALKPHOS, BILITOT, PROT, ALBUMIN in the last 168 hours. No results for input(s): LIPASE, AMYLASE in the last 168 hours. No results for input(s): AMMONIA in the last 168 hours. CBC: No results for input(s): WBC, NEUTROABS, HGB, HCT, MCV, PLT in the last 168 hours. Cardiac Enzymes: No results for input(s): CKTOTAL, CKMB, CKMBINDEX, TROPONINI in the last 168 hours. BNP: Invalid input(s): POCBNP CBG: No results for input(s): GLUCAP in the last 168 hours. D-Dimer No results for input(s): DDIMER in the last 72 hours. Hgb A1c No results for input(s): HGBA1C in the last 72 hours. Lipid Profile No results for input(s): CHOL, HDL, LDLCALC, TRIG, CHOLHDL, LDLDIRECT in the last 72 hours. Thyroid function studies No results for input(s): TSH, T4TOTAL, T3FREE, THYROIDAB in the last 72 hours.  Invalid input(s): FREET3 Anemia work up No results for input(s): VITAMINB12, FOLATE, FERRITIN, TIBC, IRON, RETICCTPCT in the last 72 hours. Urinalysis    Component Value  Date/Time   COLORURINE YELLOW (A) 05/23/2021 0750   APPEARANCEUR TURBID (A) 05/23/2021 0750   LABSPEC 1.035 (H) 05/23/2021 0750   PHURINE 5.0 05/23/2021 0750   GLUCOSEU >=500 (A) 05/23/2021 0750   HGBUR MODERATE (A) 05/23/2021 0750   BILIRUBINUR NEGATIVE 05/23/2021 0750   KETONESUR NEGATIVE 05/23/2021 0750   PROTEINUR 100 (A) 05/23/2021 0750   NITRITE NEGATIVE 05/23/2021 0750   LEUKOCYTESUR LARGE (A) 05/23/2021 0750   Sepsis Labs Invalid input(s): PROCALCITONIN,  WBC,  LACTICIDVEN Microbiology No results found for this or any previous visit (from the past  240 hour(s)).   Time coordinating discharge: Over 30 minutes  SIGNED:   Pennie Banter, DO [facility] 06/11/2021, 7:59 AM   If 7PM-7AM, please contact night-coverage [email or web address]

## 2021-06-11 NOTE — TOC Progression Note (Signed)
Transition of Care [facility]) - Progression Note    Patient Details  Name: Terrence Johnson MRN: [id number] Date of Birth: 15-Sep-1940  [facility]) CM/SW Contact  Allayne Butcher, RN Phone Number: 06/11/2021, 11:12 AM  Clinical Narrative:    EMS has been called and transport set up for after 1200.  Archie called and spoke with the bedside RN so he is aware of discharge today.  Misty with [facility] notified of discharge.  Patient will be followed by OP Palliative.     Expected Discharge Plan: Skilled Nursing Facility Barriers to Discharge: Barriers Resolved  Expected Discharge Plan and Services Expected Discharge Plan: Skilled Nursing Facility In-house Referral: Clinical Social Work     Living arrangements for the past 2 months: Assisted Living Facility ([facility]) Expected Discharge Date: 06/11/21               DME Arranged: N/A DME Agency: NA       HH Arranged: NA           Social Determinants of Health (SDOH) Interventions    Readmission Risk Interventions No flowsheet data found.

## 2021-06-23 ENCOUNTER — Encounter (HOSPITAL_COMMUNITY): Payer: Self-pay | Admitting: Radiology

## 2021-06-25 ENCOUNTER — Other Ambulatory Visit: Payer: Self-pay

## 2021-06-25 ENCOUNTER — Non-Acute Institutional Stay: Payer: Medicare Other | Admitting: Primary Care

## 2021-06-25 VITALS — BP 142/84 | HR 92 | Temp 98.2°F | Resp 18 | Ht 67.0 in | Wt 135.6 lb

## 2021-06-25 DIAGNOSIS — Z515 Encounter for palliative care: Secondary | ICD-10-CM

## 2021-06-25 DIAGNOSIS — J449 Chronic obstructive pulmonary disease, unspecified: Secondary | ICD-10-CM

## 2021-06-25 DIAGNOSIS — I214 Non-ST elevation (NSTEMI) myocardial infarction: Secondary | ICD-10-CM

## 2021-06-25 DIAGNOSIS — R4189 Other symptoms and signs involving cognitive functions and awareness: Secondary | ICD-10-CM

## 2021-06-25 NOTE — Progress Notes (Signed)
  [facility] Consult Note Telephone: [phone]  Fax: [phone]   Date of encounter: 06/25/21 12:10 PM PATIENT NAME: Terrence Johnson [facility] [address] [place] [address]   [phone] (home)  DOB: 05/21/1941 MRN: [id number] PRIMARY CARE PROVIDER:    Dr John Doug Nelson  REFERRING PROVIDER:   Dr John Doug Nelson  RESPONSIBLE PARTY:    Contact Information     Name Relation Home Work Mobile   Partin,Archie Brother [phone]     Haverstock,Larry Brother   [phone]   Patterson,Gerald Other   [phone]   Group home,Tammy Other  [phone]        Due to the COVID-19 crisis, this visit was done via telemedicine from my office and it was initiated and consent by this patient and or family.  I connected with  [name] OR PROXY on 06/25/21 by a video enabled telemedicine application and verified that I am speaking with the correct person using two identifiers.   I discussed the limitations of evaluation and management by telemedicine. The patient expressed understanding and agreed to proceed.   I met face to face with patient   in [facility]  facility. Palliative Care was asked to follow this patient by consultation request of  Dr. John Doug Nelson to address advance care planning and complex medical decision making. This is the initial visit.                                   ASSESSMENT AND PLAN / RECOMMENDATIONS:   Advance Care Planning/Goals of Care: Goals include to maximize quality of life and symptom management.  CODE STATUS:  DNR I spoke with the brother  Archie, of patient. He stated he felt patient was well cared for at the nursing home and did not have any concerns.  We will discuss advance choices on my next visit to patient.  Symptom Management/Plan:  I met with patient via video with our nurse. Patient had been referred due to decline but appears to be improving with PT in the facility.  He is able to ambulate with PT and is eating better. He has had a 12 % weight loss however over several months. I recommend dietary supplements (mighty shake, med pass) and continued assessment to see if he continues to improve in diet and function.  Meds reviewed; he may benefit from megace for appetite stimulation.  Follow up Palliative Care Visit: Palliative care will continue to follow for complex medical decision making, advance care planning, and clarification of goals. Return 2-4 weeks or prn.  I spent 35 minutes providing this consultation. More than 50% of the time in this consultation was spent in counseling and care coordination.  PPS: 40%  HOSPICE ELIGIBILITY/DIAGNOSIS: TBD  Chief Complaint: debility, weight loss  HISTORY OF PRESENT ILLNESS:  Terrence Johnson is a 80 y.o. year old male  with dementia, debility, wt loss, STEMI, CAD, CKD .   History obtained from review of EMR, discussion with primary team, and interview with family, facility staff/caregiver and/or Mr. Coval.  I reviewed available labs, medications, imaging, studies and related documents from the EMR.  Records reviewed and summarized above.   ROS  General: NAD EYES: denies vision changes ENMT: + dysphagia Cardiovascular: denies chest pain, denies DOE Pulmonary: denies cough, denies increased SOB Abdomen: endorses good appetite,  + incontinence of bowel GU: denies dysuria, +   incontinence of urine MSK:  denies weakness,  no falls reported Skin: denies rashes or wounds Neurological: denies pain, denies insomnia Psych: Endorses positive mood Heme/lymph/immuno: denies bruises, abnormal bleeding  Physical Exam: Current and past weights: 135.6 lbs.  Last hospitalization: 152.78 lbs; 12 % wt loss, Body mass index is 21.24 kg/m. Constitutional: NAD General: WNWD EYES: anicteric sclera, lids intact, no discharge  ENMT: intact hearing, oral mucous membranes moist, dentition missing CV: RRR, no LE  edema Pulmonary: LCTA, no increased work of breathing, no cough, room air Abdomen: intake 75-100%, normo-active BS + 4 quadrants, soft and non tender, no ascites GU: deferred MSK: + sarcopenia, moves all extremities, wheelchair bound but has been ambulatory with walker and PT. 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   Electrocardiography suggestive of ST elevation myocardial infarction (STEMI) ([place]) 05/23/2021   Diverticulitis of cecum 05/23/2021   COVID-19 virus infection 05/23/2021   Lobar pneumonia ([place])    Hypokalemia    CKD stage 2 due to type 2 diabetes mellitus ([place])    Acute kidney injury superimposed on CKD (HCC)    NSTEMI (non-ST elevated myocardial infarction) ([place]) 12/15/2020   Cognitive impairment    Type 2 diabetes mellitus with hyperlipidemia (HCC)    CKD (chronic kidney disease), stage IIIa    HLD (hyperlipidemia)    Hypertension    COPD (chronic obstructive pulmonary disease) (HCC)    Weakness    Onychomycosis 04/13/2020   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   SOB (shortness of breath) 03/07/2017   Schizophrenia ([place]) 63/87/5643   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   Acute cystitis 02/16/2017   PAST MEDICAL HISTORY:  Active Ambulatory Problems    Diagnosis Date Noted   Acute cystitis 02/16/2017   Schizophrenia ([place]) 16/60/6301   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[place]) 60/05/9322   Onychomycosis 04/13/2020   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[place]) 04/13/2020   SOB (shortness of breath) 03/07/2017   NSTEMI (non-ST elevated myocardial infarction) ([place]) 12/15/2020   Cognitive impairment    Type 2 diabetes mellitus with hyperlipidemia (HCC)    CKD  (chronic kidney disease), stage IIIa    HLD (hyperlipidemia)    Hypertension    COPD (chronic obstructive pulmonary disease) (HCC)    Weakness    CKD stage 2 due to type 2 diabetes mellitus (HCC)    Acute kidney injury superimposed on CKD (HCC)    Hypokalemia    Lobar pneumonia (HCC)    Electrocardiography suggestive of ST elevation myocardial infarction (STEMI) ([place]) 05/23/2021   Diverticulitis of cecum 05/23/2021   COVID-19 virus infection 05/23/2021   Acute delirium 05/25/2021   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   Hospice care patient 06/11/2021   Resolved Ambulatory Problems    Diagnosis Date Noted   No Resolved Ambulatory Problems   Past Medical History:  Diagnosis Date   Diabetes mellitus without complication ([place])    Hypercholesteremia    Psychotic disorder ([place])    SOCIAL HX:  Social History   Tobacco Use   Smoking status: Every Day   Smokeless tobacco: Never  Substance Use Topics   Alcohol use: No   FAMILY HX:  Family History  Problem Relation Age of Onset   Hypertension Mother  ALLERGIES:  Allergies  Allergen Reactions   Aricept [Donepezil] Other (See Comments)    Severe bradycardia     PERTINENT MEDICATIONS:  Outpatient Encounter Medications as of 06/25/2021  Medication Sig   acetaminophen (TYLENOL) 325 MG tablet Take 2 tablets (650 mg total) by mouth every 6 (six) hours as needed for mild pain (or Fever >/= 101).   acetaminophen (TYLENOL) 650 MG suppository Place 1 suppository (650 mg total) rectally every 6 (six) hours as needed for mild pain (or Fever >/= 101).   antiseptic oral rinse (BIOTENE) LIQD Apply 15 mLs topically as needed for dry mouth.   haloperidol (HALDOL) 0.5 MG tablet Take 1 tablet (0.5 mg total) by mouth every 4 (four) hours as needed for agitation (or delirium).   haloperidol (HALDOL) 2 MG/ML solution Place 0.3 mLs (0.6 mg total) under the tongue every 4 (four) hours as needed for agitation (or delirium).   LORazepam (ATIVAN) 1 MG  tablet Take 1 tablet (1 mg total) by mouth every 4 (four) hours as needed for anxiety.   LORazepam (ATIVAN) 2 MG/ML concentrated solution Place 0.5 mLs (1 mg total) under the tongue every 4 (four) hours as needed for anxiety.   Morphine Sulfate (MORPHINE CONCENTRATE) 10 MG/0.5ML SOLN concentrated solution Take 0.25 mLs (5 mg total) by mouth every 2 (two) hours as needed for moderate pain (or dyspnea).   ondansetron (ZOFRAN-ODT) 4 MG disintegrating tablet Take 1 tablet (4 mg total) by mouth every 6 (six) hours as needed for nausea.   senna (SENOKOT) 8.6 MG TABS tablet Take 1 tablet (8.6 mg total) by mouth at bedtime. Hold if loose or frequent stools.   No facility-administered encounter medications on file as of 06/25/2021.   Thank you for the opportunity to participate in the care of Mr. Stebner.  The palliative care team will continue to follow. Please call our office at [phone] if we can be of additional assistance.   Julie A Brown, RN ,   M  NP [facility]  COVID-19 PATIENT SCREENING TOOL Asked and negative response unless otherwise noted:  Have you had symptoms of covid, tested positive or been in contact with someone with symptoms/positive test in the past 5-10 days? No 

## 2021-07-01 ENCOUNTER — Ambulatory Visit: Payer: Medicare Other | Admitting: Podiatry

## 2021-07-22 DEATH — deceased

## 2021-08-24 IMAGING — CT CT HEAD W/O CM
3 series · 15 of 47 positions shown, 18 images · non-contrast
Comparison: 12/15/2020.

CLINICAL DATA: Bilateral leg weakness and altered mental status.

EXAM:
CT HEAD WITHOUT CONTRAST
TECHNIQUE: Contiguous axial images were obtained from the base of the skull
through the vertex without intravenous contrast.

[Series 2: head wo · axial · 0.46mm/px · z∈[-144,-4]mm · 9 of 34 slices shown, 12 images]
[im 3/34  brain]
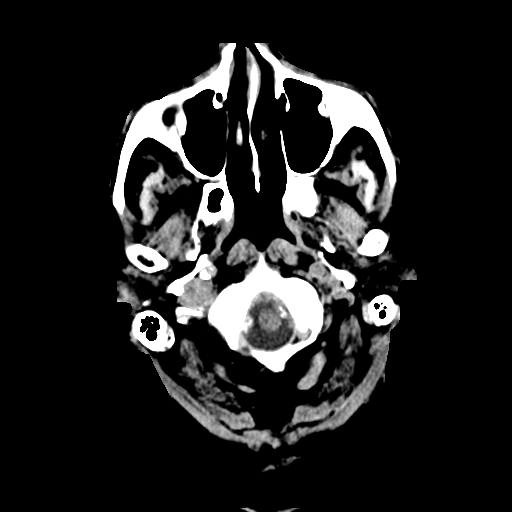
[im 3/34  bone]
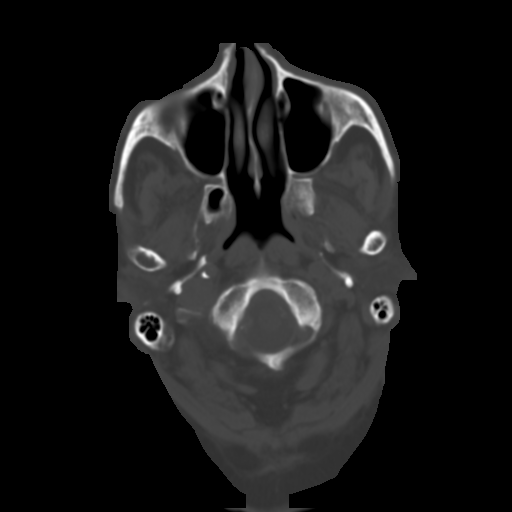
[im 6/34  brain]
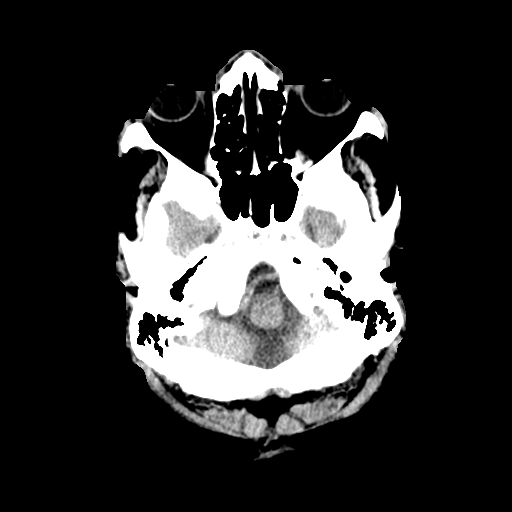
[im 10/34  brain]
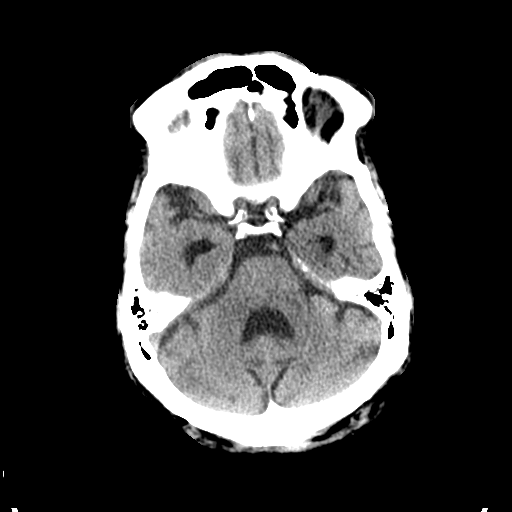
[im 13/34  brain]
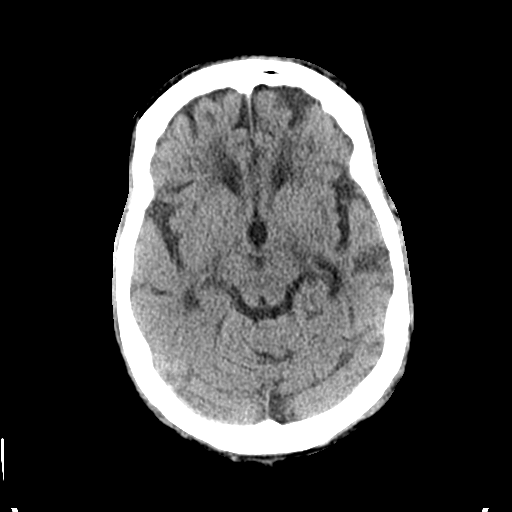
[im 18/34  brain]
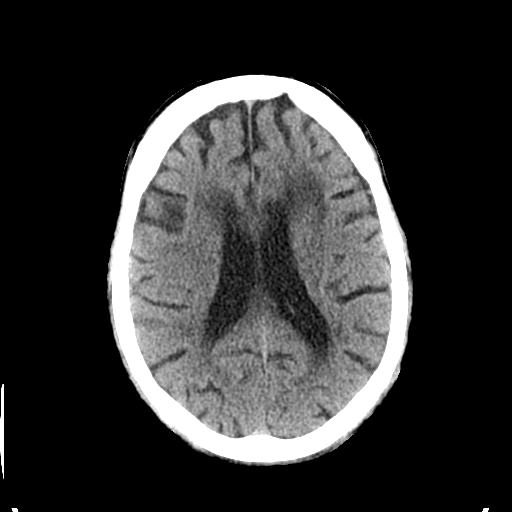
[im 18/34  bone]
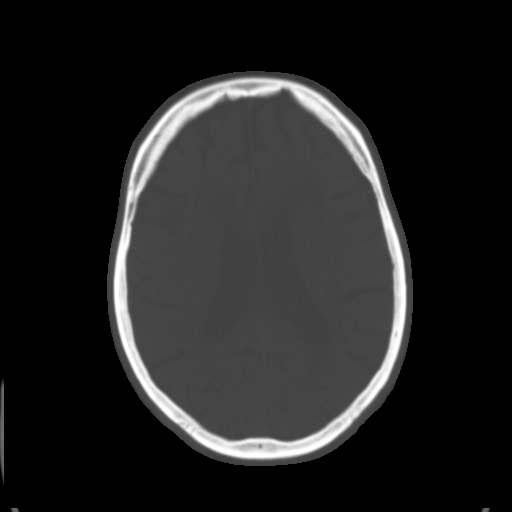
[im 21/34  brain]
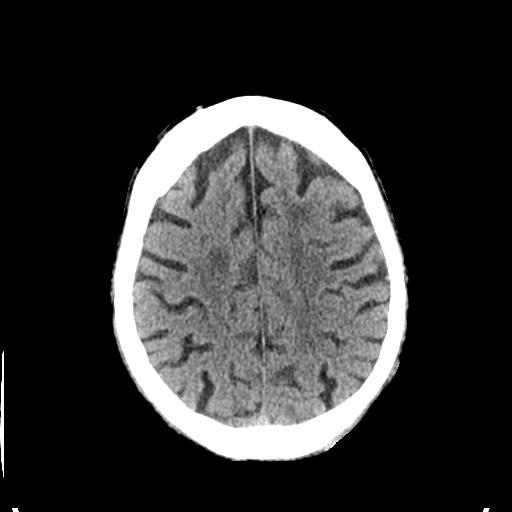
[im 24/34  brain]
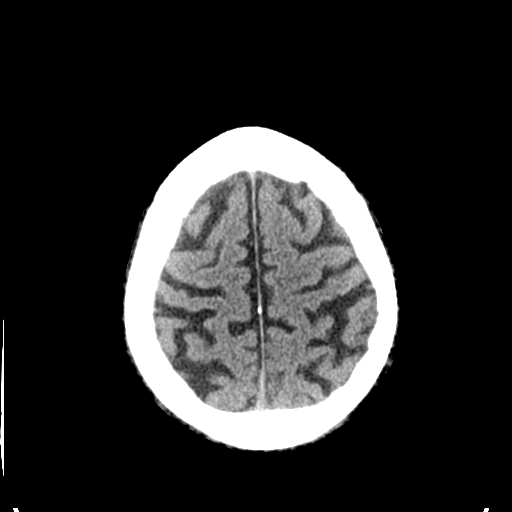
[im 28/34  brain]
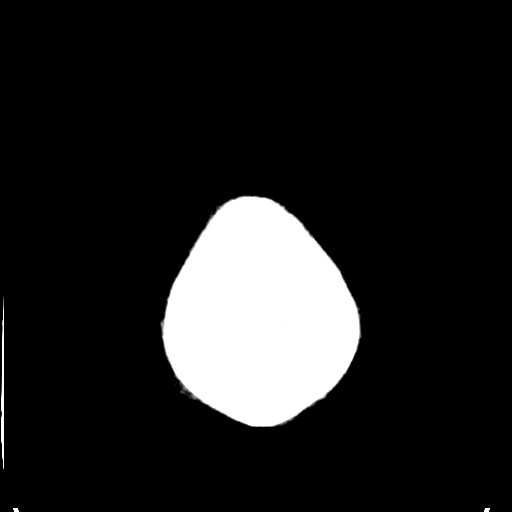
[im 31/34  brain]
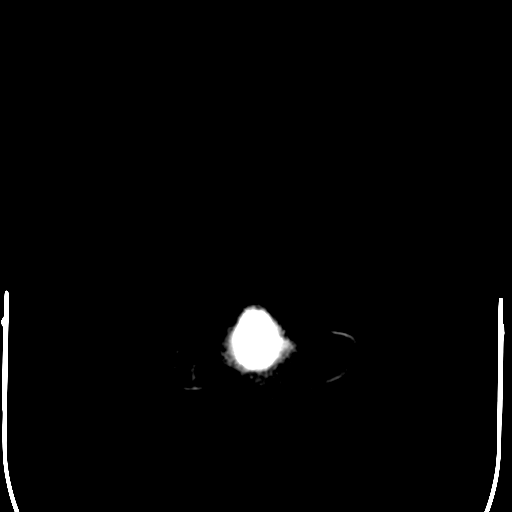
[im 31/34  bone]
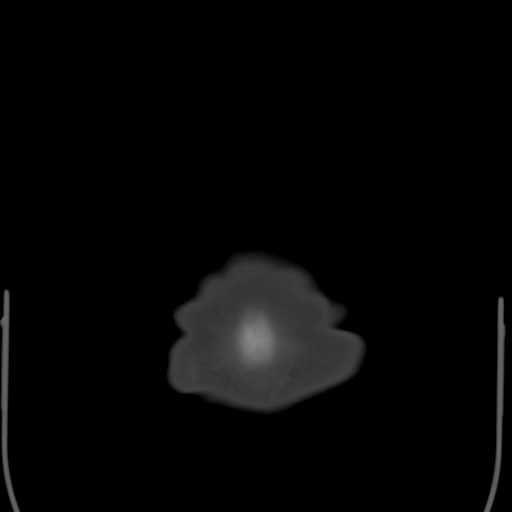

[Series 4: coronal soft tissue · coronal · 0.33mm/px · 3 of 76 slices shown]
[im 26/76  brain]
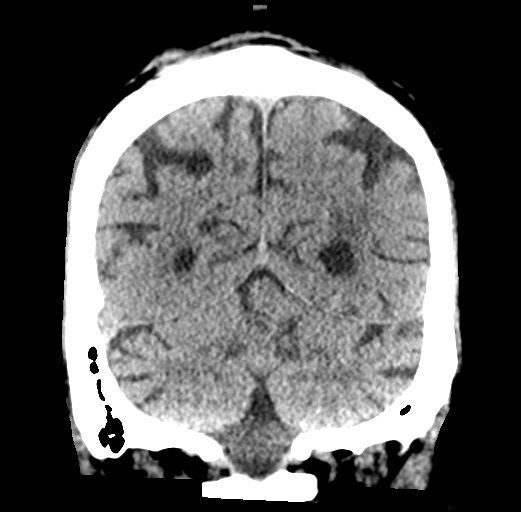
[im 34/76  brain]
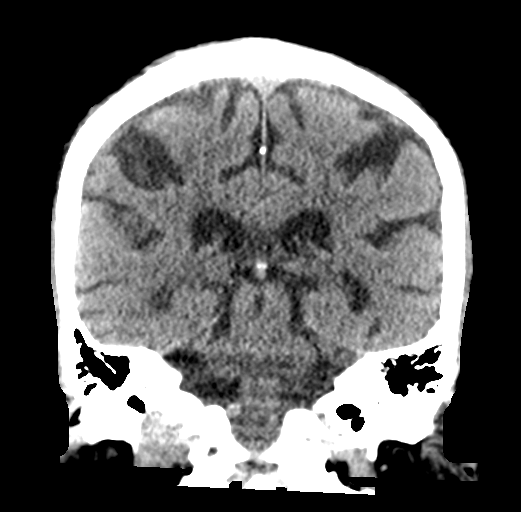
[im 42/76  brain]
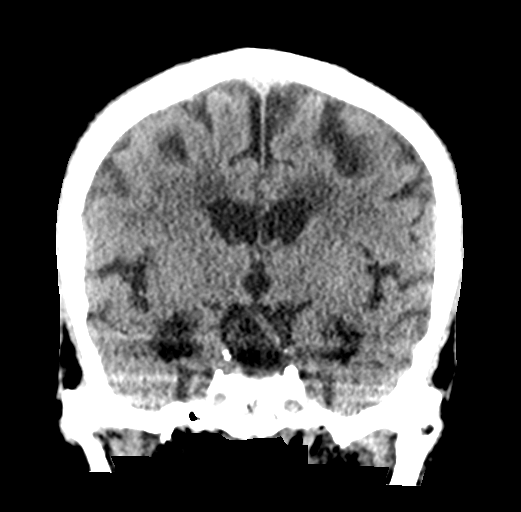

[Series 5: sagittal soft tissue · sagittal · 0.33mm/px · 3 of 58 slices shown]
[im 20/58  brain]
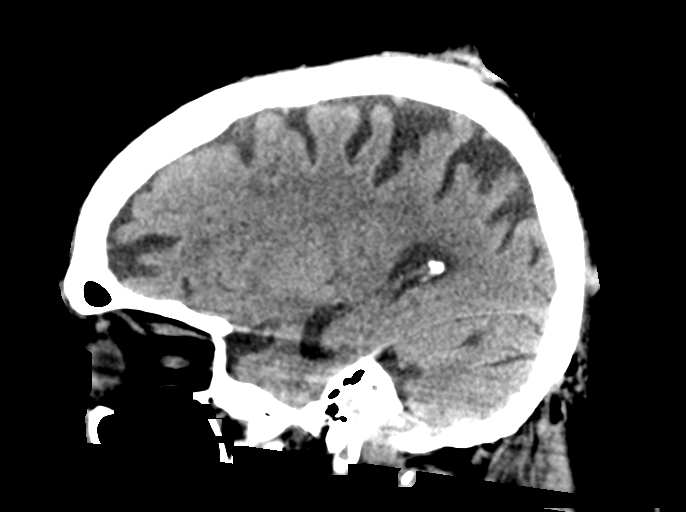
[im 29/58  brain]
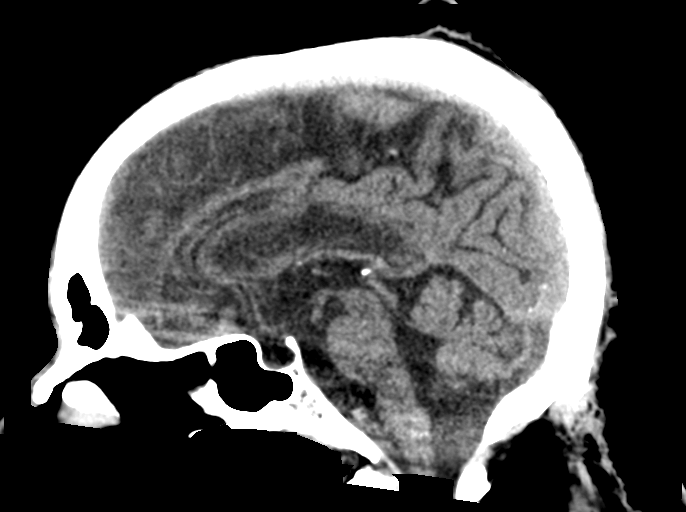
[im 39/58  brain]
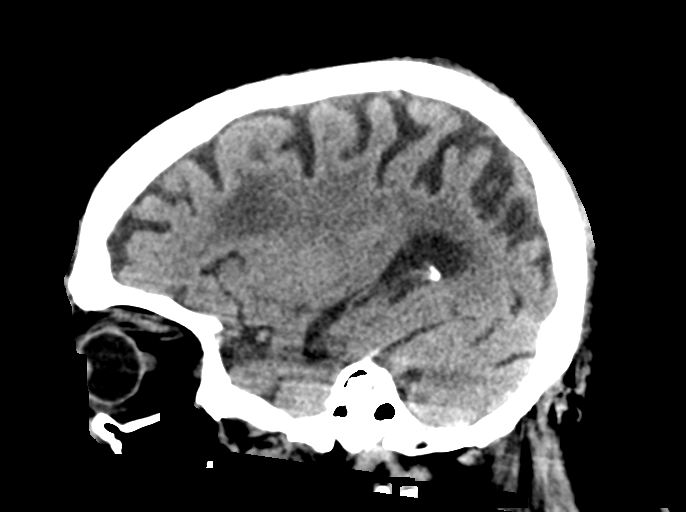

[15 of 47 positions shown; findings below may reference images not displayed]

FINDINGS: Brain: No evidence of acute infarction, hemorrhage, hydrocephalus,
extra-axial collection or mass lesion/mass effect.

There is ventricular sulcal enlargement consistent with age
appropriate volume loss. Patchy areas of white matter
hypoattenuation are noted bilaterally, mostly in the frontal lobes,
consistent with moderate chronic microvascular ischemic change.
Small old right cerebellar infarcts.

Vascular: No hyperdense vessel or unexpected calcification.

Skull: Normal. Negative for fracture or focal lesion.

Sinuses/Orbits: Globes and orbits are unremarkable. Visualized
sinuses are clear.

Other: None.
IMPRESSION: 1. No acute intracranial abnormalities.
2. Age-appropriate volume loss, moderate chronic microvascular
ischemic change and small old right cerebellar infarcts, stable from
the prior study.
